# Patient Record
Sex: Male | Born: 1935 | Race: White | Hispanic: No | Marital: Married | Smoking: Former smoker
Health system: Southern US, Community
[De-identification: ages and names within clinical notes are randomized; demographics above are authoritative.]

## PROBLEM LIST (undated history)

## (undated) DIAGNOSIS — F0390 Unspecified dementia without behavioral disturbance: Secondary | ICD-10-CM

## (undated) DIAGNOSIS — I10 Essential (primary) hypertension: Secondary | ICD-10-CM

## (undated) DIAGNOSIS — H9190 Unspecified hearing loss, unspecified ear: Secondary | ICD-10-CM

---

## 2008-12-01 ENCOUNTER — Encounter: Admission: RE | Admit: 2008-12-01 | Discharge: 2008-12-01 | Payer: Self-pay | Admitting: Internal Medicine

## 2008-12-17 ENCOUNTER — Ambulatory Visit (HOSPITAL_COMMUNITY): Admission: RE | Admit: 2008-12-17 | Discharge: 2008-12-17 | Payer: Self-pay | Admitting: Urology

## 2008-12-23 ENCOUNTER — Encounter (HOSPITAL_COMMUNITY): Admission: RE | Admit: 2008-12-23 | Discharge: 2009-03-18 | Payer: Self-pay | Admitting: Urology

## 2008-12-28 ENCOUNTER — Encounter (INDEPENDENT_AMBULATORY_CARE_PROVIDER_SITE_OTHER): Payer: Self-pay | Admitting: Urology

## 2008-12-28 ENCOUNTER — Ambulatory Visit (HOSPITAL_BASED_OUTPATIENT_CLINIC_OR_DEPARTMENT_OTHER): Admission: RE | Admit: 2008-12-28 | Discharge: 2008-12-28 | Payer: Self-pay | Admitting: Urology

## 2010-07-11 ENCOUNTER — Encounter: Payer: Self-pay | Admitting: Internal Medicine

## 2010-07-25 IMAGING — CR DG CHEST 2V
2 series · 2 of 2 positions shown · non-contrast
Comparison: None

CLINICAL DATA: Gross hematuria, preop

CHEST - 2 VIEW

[view not recorded (1 of 2)]
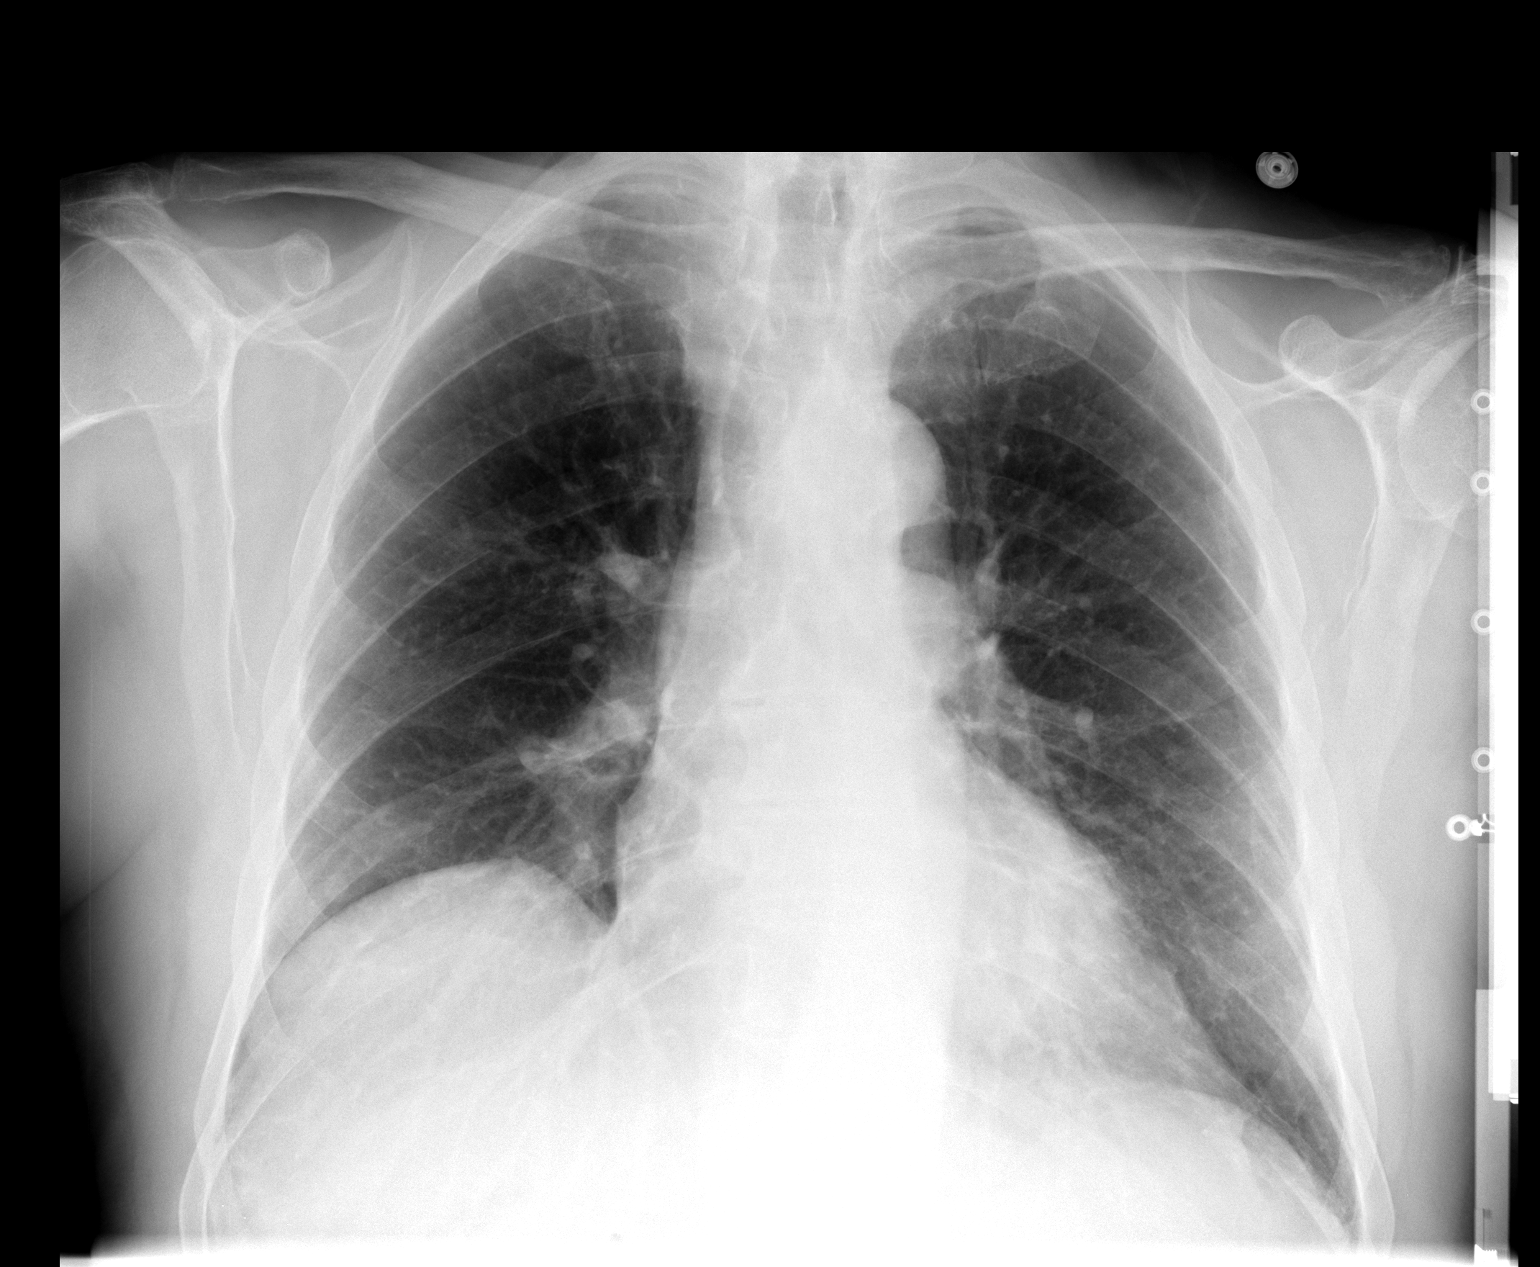

[view not recorded (2 of 2)]
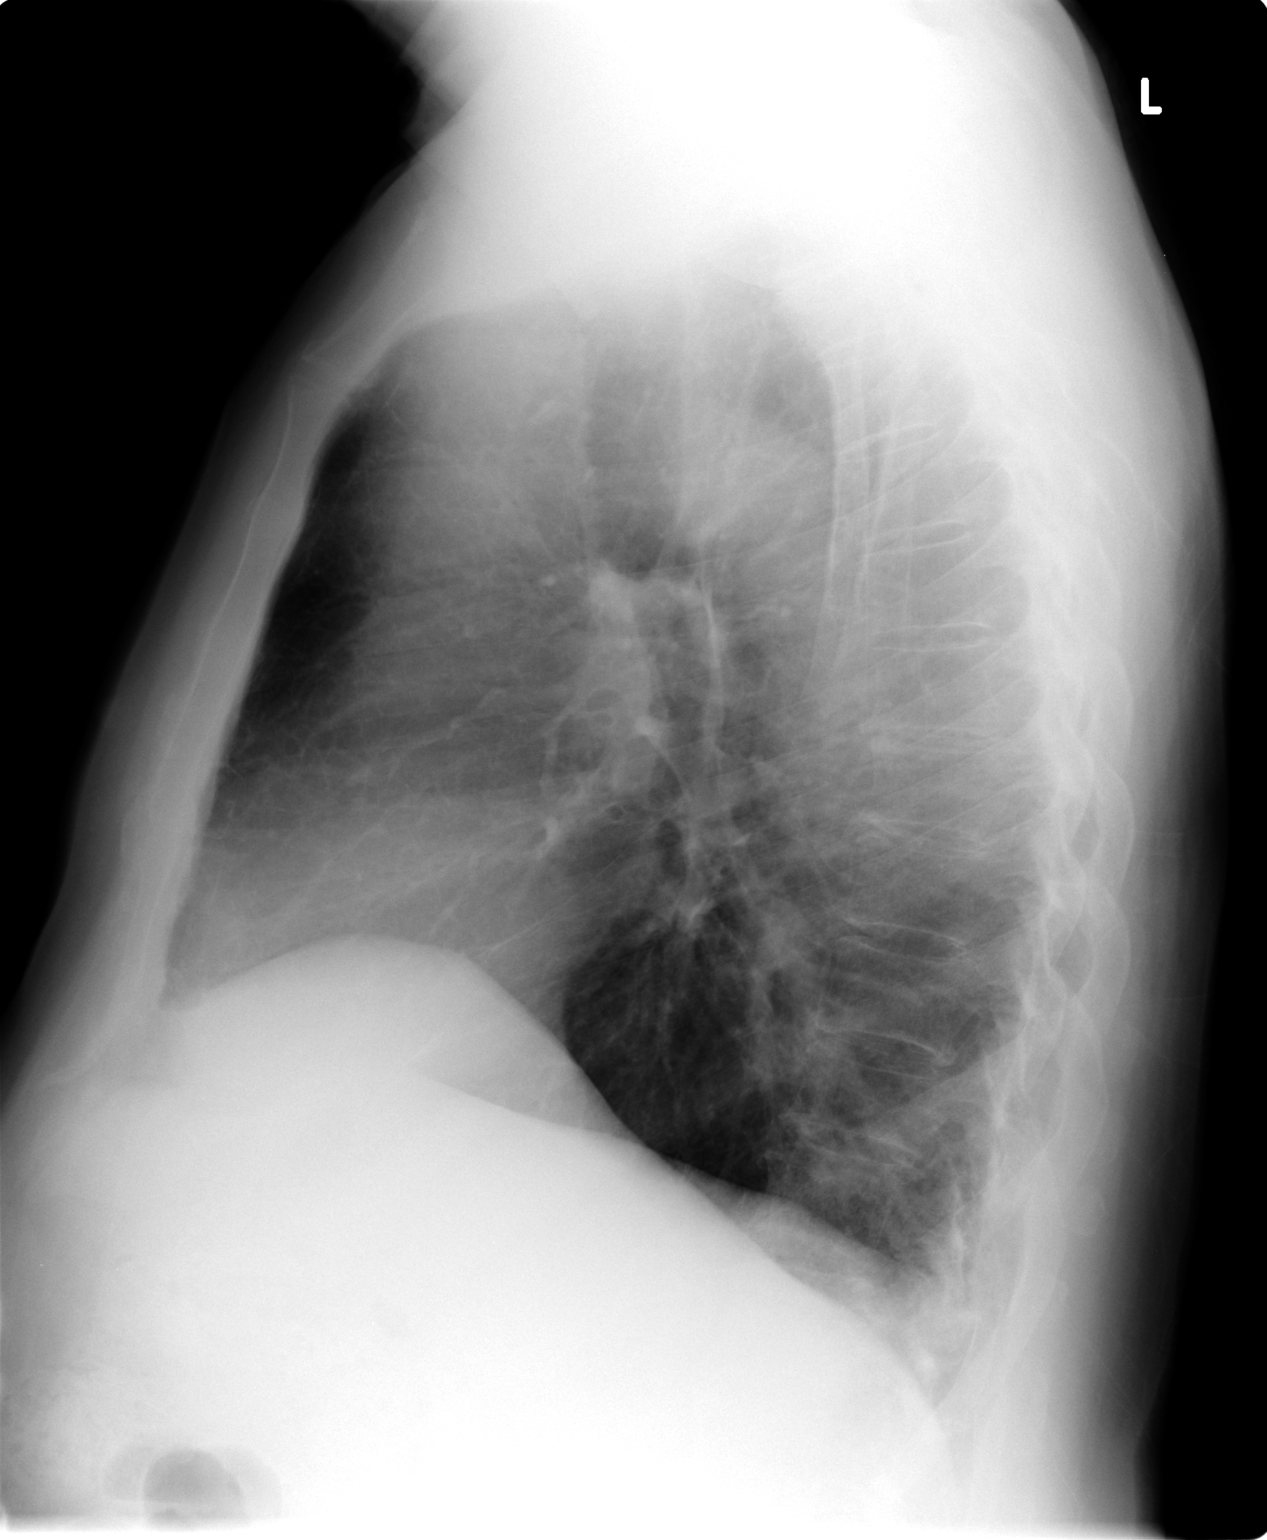

[2 of 2 positions shown; findings below may reference images not displayed]

FINDINGS: No active infiltrate or effusion is seen.  A small
nodular opacity at the right lung base may represent nipple shadow
but follow-up chest x-ray with nipple markers is recommended. The
heart is within normal limits in size. No bony abnormality is seen.
IMPRESSION: No active lung disease. Probable nipple shadow at the right lung
base.  Recommend follow-up chest x-ray with nipple markers.

## 2010-09-25 LAB — CROSSMATCH
ABO/RH(D): O POS
Antibody Screen: NEGATIVE

## 2010-09-25 LAB — COMPREHENSIVE METABOLIC PANEL
ALT: 16 U/L (ref 0–53)
ALT: 17 U/L (ref 0–53)
AST: 22 U/L (ref 0–37)
AST: 28 U/L (ref 0–37)
Albumin: 3.7 g/dL (ref 3.5–5.2)
Albumin: 3.8 g/dL (ref 3.5–5.2)
Alkaline Phosphatase: 39 U/L (ref 39–117)
Alkaline Phosphatase: 46 U/L (ref 39–117)
BUN: 19 mg/dL (ref 6–23)
BUN: 19 mg/dL (ref 6–23)
CO2: 23 mEq/L (ref 19–32)
CO2: 25 mEq/L (ref 19–32)
Calcium: 8.6 mg/dL (ref 8.4–10.5)
Calcium: 8.9 mg/dL (ref 8.4–10.5)
Chloride: 107 mEq/L (ref 96–112)
Chloride: 108 mEq/L (ref 96–112)
Creatinine, Ser: 1.13 mg/dL (ref 0.4–1.5)
Creatinine, Ser: 1.16 mg/dL (ref 0.4–1.5)
GFR calc Af Amer: >60 mL/min (ref >60)
GFR calc Af Amer: >60 mL/min (ref >60)
GFR calc non Af Amer: 60 mL/min — ABNORMAL LOW (ref >60)
GFR calc non Af Amer: >60 mL/min (ref >60)
Glucose, Bld: 114 mg/dL — ABNORMAL HIGH (ref 70–99)
Glucose, Bld: 114 mg/dL — ABNORMAL HIGH (ref 70–99)
Potassium: 3.8 mEq/L (ref 3.5–5.1)
Potassium: 4 mEq/L (ref 3.5–5.1)
Sodium: 138 mEq/L (ref 135–145)
Sodium: 140 mEq/L (ref 135–145)
Total Bilirubin: 0.4 mg/dL (ref 0.3–1.2)
Total Bilirubin: 0.5 mg/dL (ref 0.3–1.2)
Total Protein: 6.3 g/dL (ref 6.0–8.3)
Total Protein: 6.9 g/dL (ref 6.0–8.3)

## 2010-09-25 LAB — PROTIME-INR
INR: 1.1 (ref 0.00–1.49)
INR: 1.1 (ref 0.00–1.49)
Prothrombin Time: 14 seconds (ref 11.6–15.2)
Prothrombin Time: 14.7 seconds (ref 11.6–15.2)

## 2010-09-25 LAB — CBC
HCT: 23 % — ABNORMAL LOW (ref 39.0–52.0)
HCT: 32.3 % — ABNORMAL LOW (ref 39.0–52.0)
Hemoglobin: 10.6 g/dL — ABNORMAL LOW (ref 13.0–17.0)
Hemoglobin: 7.6 g/dL — CL (ref 13.0–17.0)
MCHC: 32.7 g/dL (ref 30.0–36.0)
MCHC: 33.2 g/dL (ref 30.0–36.0)
MCV: 83.9 fL (ref 78.0–100.0)
MCV: 85.6 fL (ref 78.0–100.0)
Platelets: 216 10*3/uL (ref 150–400)
Platelets: 222 10*3/uL (ref 150–400)
RBC: 2.69 MIL/uL — ABNORMAL LOW (ref 4.22–5.81)
RBC: 3.85 MIL/uL — ABNORMAL LOW (ref 4.22–5.81)
RDW: 13.6 % (ref 11.5–15.5)
RDW: 14.4 % (ref 11.5–15.5)
WBC: 6 10*3/uL (ref 4.0–10.5)
WBC: 6.6 10*3/uL (ref 4.0–10.5)

## 2010-09-25 LAB — ABO/RH: ABO/RH(D): O POS

## 2010-09-25 LAB — APTT
aPTT: 31 seconds (ref 24–37)
aPTT: 32 seconds (ref 24–37)

## 2010-11-01 NOTE — Op Note (Signed)
Greg Young, Greg Young               ACCOUNT NO.:  [id number]   MEDICAL RECORD NO.:  [id number]          PATIENT TYPE:  AMB   LOCATION:  [facility]                         FACILITY:  [facility]   PHYSICIAN:  Mark C. Vernie Ammons, M.D.  DATE OF BIRTH:  September 01, 1921   DATE OF PROCEDURE:  12/28/2008  DATE OF DISCHARGE:                               OPERATIVE REPORT   PREOPERATIVE DIAGNOSIS:  Gross hematuria.   POSTOPERATIVE DIAGNOSES:  1. Gross hematuria.  2. Bladder diverticular lesion.   PROCEDURES:  1. Cystoscopy.  2. Bladder biopsy.  3. Barbotage urine.  4. Fulguration of biopsy site.   SURGEON:  Mark C. Vernie Ammons, M.D.   ANESTHESIA:  General.   ESTIMATED BLOOD LOSS:  Minimal.   DRAINS:  None.   SPECIMENS:  1. Cold cup biopsy of the diverticular lesion.  2. Barbotage bladder washings for cytology.   COMPLICATIONS:  None.   INDICATIONS:  The patient is an 77 year old male who had gross  hematuria.  A CT scan revealed no abnormality of the upper tract,  although he did have a filling defect in the bladder that was felt to  possibly be a mass or clot.  Office cystoscopy revealed a large clot  adherent to posterior wall of the bladder where it was seen on CT scan  and the patient was also found to have anemia with a hemoglobin of 7.6  and a hematocrit of 23.0.  He was transfused 2 units of red blood cells,  underwent cardiac evaluation, and recheck of his hemoglobin and  hematocrit today reveals 10.6 and 32.3 respectively.  His PT and PTT are  normal and he has a normal platelet count.  We discussed the need to  evaluate his bladder further and to evacuate any clot to allow better  visualization of the bladder.  I have gone over the procedure in detail  with the patient, as well as its risks and complications.  He  understands and elected to proceed.   DESCRIPTION OF OPERATION:  After informed consent, the patient was  brought to the major OR and placed on the table, administered general  anesthesia, then moved to the dorsal lithotomy position.  His genitalia  was sterilely prepped and draped and an official time-out was then  performed.   Initially a 22-French cystoscope with 12-degree lens was passed under  direct vision down the urethra, which was noted to be entirely normal,  through the prostatic urethra, which revealed some trilobar hypertrophy,  but no intraprostatic lesions were identified.  Upon entering the  bladder, I note 4+ trabeculation with multiple cellules throughout the  bladder.  Some were wide mouthed and there was one on the posterior wall  that was narrow mouthed.  No tumor, stones, inflammatory lesions, or  clots were seen within the bladder on initial inspection.   A full inspection of the bladder was first undertaken with the rigid  cystoscope and no lesions were identified.  I then switched to the  flexible scope in order to visualize each of the diverticula.  A  systematic search of each of the pseudo-diverticula was  then undertaken.  All of them appeared to have normal-appearing mucosa except for a single  large-mouthed diverticulum located at the dome of the bladder.  Within  this diverticulum, there was an area of erythematous mucosa.  No  papillary lesions were seen though.   The cold cup forceps were used to obtain a biopsy from the erythematous  lesion and then this area was fulgurated with the Bugbee electrode.   I then, using sterile saline, performed barbotage of the bladder and  sent this for cytology.  Re-inspection of the bladder revealed no active  bleeding and therefore, the cystoscope was removed after the bladder was  drained and the patient was awakened and taken to the recovery room in  stable and satisfactory condition.  He tolerated the procedure well and  there were no intraoperative complications.   He will be discharged after he voids later today and received a B and O  suppository intraoperatively.  He will follow up  in my office on  01/11/2009 and contact me if he has any difficulty in the interim.      Mark C. Vernie Ammons, M.D.  Electronically Signed     MCO/MEDQ  D:  12/28/2008  T:  12/28/2008  Job:  [id number]

## 2013-01-29 ENCOUNTER — Ambulatory Visit (INDEPENDENT_AMBULATORY_CARE_PROVIDER_SITE_OTHER): Payer: Medicare Other | Admitting: General Surgery

## 2013-02-04 ENCOUNTER — Ambulatory Visit (INDEPENDENT_AMBULATORY_CARE_PROVIDER_SITE_OTHER): Payer: Medicare Other | Admitting: General Surgery

## 2013-02-28 ENCOUNTER — Other Ambulatory Visit: Payer: Self-pay | Admitting: Internal Medicine

## 2013-02-28 DIAGNOSIS — R410 Disorientation, unspecified: Secondary | ICD-10-CM

## 2013-02-28 DIAGNOSIS — R634 Abnormal weight loss: Secondary | ICD-10-CM

## 2013-03-04 ENCOUNTER — Ambulatory Visit
Admission: RE | Admit: 2013-03-04 | Discharge: 2013-03-04 | Disposition: A | Discharge status: Home / Self Care | Payer: Medicare Other | Source: Ambulatory Visit | Attending: Internal Medicine | Admitting: Internal Medicine

## 2013-03-04 DIAGNOSIS — R410 Disorientation, unspecified: Secondary | ICD-10-CM

## 2013-03-04 DIAGNOSIS — R634 Abnormal weight loss: Secondary | ICD-10-CM

## 2013-03-04 MED ORDER — IOHEXOL 300 MG/ML  SOLN
100.0000 mL | Freq: Once | INTRAMUSCULAR | Status: AC | PRN
  Administered 2013-03-04: 100 mL via INTRAVENOUS

## 2013-06-26 ENCOUNTER — Inpatient Hospital Stay (HOSPITAL_COMMUNITY)
Admission: EM | Admit: 2013-06-26 | Discharge: 2013-07-01 | DRG: 871 | Disposition: A | Discharge status: Home Health | Payer: Medicare Other | Attending: Internal Medicine | Admitting: Internal Medicine

## 2013-06-26 ENCOUNTER — Encounter (HOSPITAL_COMMUNITY): Payer: Self-pay | Admitting: Emergency Medicine

## 2013-06-26 ENCOUNTER — Emergency Department (HOSPITAL_COMMUNITY): Payer: Medicare Other

## 2013-06-26 DIAGNOSIS — I10 Essential (primary) hypertension: Secondary | ICD-10-CM | POA: Diagnosis present

## 2013-06-26 DIAGNOSIS — E860 Dehydration: Secondary | ICD-10-CM | POA: Diagnosis present

## 2013-06-26 DIAGNOSIS — J189 Pneumonia, unspecified organism: Secondary | ICD-10-CM | POA: Diagnosis present

## 2013-06-26 DIAGNOSIS — Z66 Do not resuscitate: Secondary | ICD-10-CM | POA: Diagnosis present

## 2013-06-26 DIAGNOSIS — I2489 Other forms of acute ischemic heart disease: Secondary | ICD-10-CM | POA: Diagnosis present

## 2013-06-26 DIAGNOSIS — H9190 Unspecified hearing loss, unspecified ear: Secondary | ICD-10-CM | POA: Diagnosis present

## 2013-06-26 DIAGNOSIS — A403 Sepsis due to Streptococcus pneumoniae: Principal | ICD-10-CM | POA: Diagnosis present

## 2013-06-26 DIAGNOSIS — Z87891 Personal history of nicotine dependence: Secondary | ICD-10-CM

## 2013-06-26 DIAGNOSIS — Z79899 Other long term (current) drug therapy: Secondary | ICD-10-CM

## 2013-06-26 DIAGNOSIS — N179 Acute kidney failure, unspecified: Secondary | ICD-10-CM | POA: Diagnosis present

## 2013-06-26 DIAGNOSIS — R6889 Other general symptoms and signs: Secondary | ICD-10-CM

## 2013-06-26 DIAGNOSIS — R7989 Other specified abnormal findings of blood chemistry: Secondary | ICD-10-CM | POA: Diagnosis present

## 2013-06-26 DIAGNOSIS — F0390 Unspecified dementia without behavioral disturbance: Secondary | ICD-10-CM | POA: Diagnosis present

## 2013-06-26 DIAGNOSIS — H9193 Unspecified hearing loss, bilateral: Secondary | ICD-10-CM

## 2013-06-26 DIAGNOSIS — G9340 Encephalopathy, unspecified: Secondary | ICD-10-CM | POA: Diagnosis present

## 2013-06-26 DIAGNOSIS — R6510 Systemic inflammatory response syndrome (SIRS) of non-infectious origin without acute organ dysfunction: Secondary | ICD-10-CM | POA: Diagnosis present

## 2013-06-26 DIAGNOSIS — R778 Other specified abnormalities of plasma proteins: Secondary | ICD-10-CM | POA: Diagnosis present

## 2013-06-26 DIAGNOSIS — I248 Other forms of acute ischemic heart disease: Secondary | ICD-10-CM | POA: Diagnosis present

## 2013-06-26 DIAGNOSIS — R748 Abnormal levels of other serum enzymes: Secondary | ICD-10-CM | POA: Diagnosis present

## 2013-06-26 DIAGNOSIS — R6520 Severe sepsis without septic shock: Secondary | ICD-10-CM | POA: Diagnosis present

## 2013-06-26 DIAGNOSIS — A419 Sepsis, unspecified organism: Secondary | ICD-10-CM | POA: Diagnosis present

## 2013-06-26 HISTORY — DX: Unspecified dementia, unspecified severity, without behavioral disturbance, psychotic disturbance, mood disturbance, and anxiety: F03.90

## 2013-06-26 HISTORY — DX: Unspecified hearing loss, unspecified ear: H91.90

## 2013-06-26 HISTORY — DX: Essential (primary) hypertension: I10

## 2013-06-26 LAB — COMPREHENSIVE METABOLIC PANEL
ALT: 9 U/L (ref 0–53)
AST: 20 U/L (ref 0–37)
Albumin: 2.8 g/dL — ABNORMAL LOW (ref 3.5–5.2)
Alkaline Phosphatase: 45 U/L (ref 39–117)
BUN: 24 mg/dL — ABNORMAL HIGH (ref 6–23)
CALCIUM: 8.4 mg/dL (ref 8.4–10.5)
CO2: 20 mEq/L (ref 19–32)
CREATININE: 1.47 mg/dL — AB (ref 0.50–1.35)
Chloride: 101 mEq/L (ref 96–112)
GFR calc Af Amer: 47 mL/min — ABNORMAL LOW (ref >90)
GFR calc non Af Amer: 40 mL/min — ABNORMAL LOW (ref >90)
Glucose, Bld: 120 mg/dL — ABNORMAL HIGH (ref 70–99)
Potassium: 4 mEq/L (ref 3.7–5.3)
Sodium: 137 mEq/L (ref 137–147)
Total Bilirubin: 0.7 mg/dL (ref 0.3–1.2)
Total Protein: 6.1 g/dL (ref 6.0–8.3)

## 2013-06-26 LAB — POCT I-STAT TROPONIN I: TROPONIN I, POC: 0.18 ng/mL — AB (ref 0.00–0.08)

## 2013-06-26 LAB — CG4 I-STAT (LACTIC ACID): LACTIC ACID, VENOUS: 2.07 mmol/L (ref 0.5–2.2)

## 2013-06-26 LAB — TROPONIN I: Troponin I: <0.3 ng/mL (ref <0.30)

## 2013-06-26 LAB — CBC WITH DIFFERENTIAL/PLATELET
BASOS ABS: 0 10*3/uL (ref 0.0–0.1)
Basophils Relative: 0 % (ref 0–1)
EOS ABS: 0 10*3/uL (ref 0.0–0.7)
EOS PCT: 0 % (ref 0–5)
HEMATOCRIT: 36.3 % — AB (ref 39.0–52.0)
Hemoglobin: 12.1 g/dL — ABNORMAL LOW (ref 13.0–17.0)
Lymphocytes Relative: 7 % — ABNORMAL LOW (ref 12–46)
Lymphs Abs: 0.7 10*3/uL (ref 0.7–4.0)
MCH: 29.6 pg (ref 26.0–34.0)
MCHC: 33.3 g/dL (ref 30.0–36.0)
MCV: 88.8 fL (ref 78.0–100.0)
Monocytes Absolute: 0.7 10*3/uL (ref 0.1–1.0)
Monocytes Relative: 7 % (ref 3–12)
Neutro Abs: 8.3 10*3/uL — ABNORMAL HIGH (ref 1.7–7.7)
Neutrophils Relative %: 86 % — ABNORMAL HIGH (ref 43–77)
Platelets: 169 10*3/uL (ref 150–400)
RBC: 4.09 MIL/uL — ABNORMAL LOW (ref 4.22–5.81)
RDW: 12.9 % (ref 11.5–15.5)
WBC: 9.7 10*3/uL (ref 4.0–10.5)

## 2013-06-26 LAB — URINALYSIS, ROUTINE W REFLEX MICROSCOPIC
BILIRUBIN URINE: NEGATIVE
Glucose, UA: NEGATIVE mg/dL
Ketones, ur: NEGATIVE mg/dL
NITRITE: NEGATIVE
PH: 6 (ref 5.0–8.0)
Protein, ur: 30 mg/dL — AB
SPECIFIC GRAVITY, URINE: 1.015 (ref 1.005–1.030)
Urobilinogen, UA: 0.2 mg/dL (ref 0.0–1.0)

## 2013-06-26 LAB — URINE MICROSCOPIC-ADD ON

## 2013-06-26 LAB — EXPECTORATED SPUTUM ASSESSMENT W REFEX TO RESP CULTURE

## 2013-06-26 LAB — INFLUENZA PANEL BY PCR (TYPE A & B)
H1N1FLUPCR: NOT DETECTED
INFLAPCR: NEGATIVE
INFLBPCR: NEGATIVE

## 2013-06-26 LAB — EXPECTORATED SPUTUM ASSESSMENT W GRAM STAIN, RFLX TO RESP C

## 2013-06-26 LAB — MRSA PCR SCREENING: MRSA by PCR: NEGATIVE

## 2013-06-26 LAB — STREP PNEUMONIAE URINARY ANTIGEN: Strep Pneumo Urinary Antigen: POSITIVE — AB

## 2013-06-26 MED ORDER — SODIUM CHLORIDE 0.9 % IJ SOLN
3.0000 mL | Freq: Two times a day (BID) | INTRAMUSCULAR | Status: DC
Start: 2013-06-26 — End: 2013-07-01
  Administered 2013-06-27 – 2013-06-30 (×6): 3 mL via INTRAVENOUS

## 2013-06-26 MED ORDER — ONDANSETRON HCL 4 MG PO TABS: 4.0000 mg | ORAL_TABLET | Freq: Four times a day (QID) | ORAL | Status: DC | PRN

## 2013-06-26 MED ORDER — MIRTAZAPINE 30 MG PO TABS
30.0000 mg | ORAL_TABLET | Freq: Every day | ORAL | Status: DC
  Administered 2013-06-26 – 2013-06-30 (×5): 30 mg via ORAL
  Filled 2013-06-26 (×6): qty 1

## 2013-06-26 MED ORDER — SODIUM CHLORIDE 0.9 % IV BOLUS (SEPSIS)
500.0000 mL | Freq: Once | INTRAVENOUS | Status: AC
  Administered 2013-06-26: 500 mL via INTRAVENOUS

## 2013-06-26 MED ORDER — GUAIFENESIN ER 600 MG PO TB12
600.0000 mg | ORAL_TABLET | Freq: Two times a day (BID) | ORAL | Status: DC
Start: 2013-06-26 — End: 2013-07-01
  Administered 2013-06-26 – 2013-07-01 (×10): 600 mg via ORAL
  Filled 2013-06-26 (×12): qty 1

## 2013-06-26 MED ORDER — OSELTAMIVIR PHOSPHATE 75 MG PO CAPS
75.0000 mg | ORAL_CAPSULE | Freq: Every day | ORAL | Status: DC
  Administered 2013-06-26: 75 mg via ORAL
  Filled 2013-06-26 (×3): qty 1

## 2013-06-26 MED ORDER — DONEPEZIL HCL 10 MG PO TABS
10.0000 mg | ORAL_TABLET | Freq: Every day | ORAL | Status: DC
  Administered 2013-06-26 – 2013-06-30 (×5): 10 mg via ORAL
  Filled 2013-06-26 (×6): qty 1

## 2013-06-26 MED ORDER — SODIUM CHLORIDE 0.9 % IV SOLN
INTRAVENOUS | Status: DC
  Administered 2013-06-26 – 2013-06-27 (×2): via INTRAVENOUS

## 2013-06-26 MED ORDER — ALBUTEROL SULFATE (2.5 MG/3ML) 0.083% IN NEBU
2.5000 mg | INHALATION_SOLUTION | Freq: Four times a day (QID) | RESPIRATORY_TRACT | Status: DC
  Administered 2013-06-26: 2.5 mg via RESPIRATORY_TRACT
  Filled 2013-06-26: qty 3

## 2013-06-26 MED ORDER — ALBUTEROL SULFATE (2.5 MG/3ML) 0.083% IN NEBU
5.0000 mg | INHALATION_SOLUTION | Freq: Once | RESPIRATORY_TRACT | Status: AC
Start: 2013-06-26 — End: 2013-06-26
  Administered 2013-06-26: 5 mg via RESPIRATORY_TRACT
  Filled 2013-06-26: qty 6

## 2013-06-26 MED ORDER — ONDANSETRON HCL 4 MG/2ML IJ SOLN: 4.0000 mg | Freq: Four times a day (QID) | INTRAMUSCULAR | Status: DC | PRN

## 2013-06-26 MED ORDER — SODIUM CHLORIDE 0.9 % IV BOLUS (SEPSIS)
1000.0000 mL | Freq: Once | INTRAVENOUS | Status: AC
  Administered 2013-06-26: 1000 mL via INTRAVENOUS

## 2013-06-26 MED ORDER — ACETAMINOPHEN 325 MG PO TABS
650.0000 mg | ORAL_TABLET | Freq: Four times a day (QID) | ORAL | Status: DC | PRN
  Administered 2013-06-27 – 2013-06-28 (×2): 650 mg via ORAL
  Filled 2013-06-26 (×2): qty 2

## 2013-06-26 MED ORDER — DEXTROSE 5 % IV SOLN
500.0000 mg | INTRAVENOUS | Status: DC
  Administered 2013-06-27: 500 mg via INTRAVENOUS
  Filled 2013-06-26 (×2): qty 500

## 2013-06-26 MED ORDER — ACETAMINOPHEN 650 MG RE SUPP: 650.0000 mg | Freq: Four times a day (QID) | RECTAL | Status: DC | PRN

## 2013-06-26 MED ORDER — AZITHROMYCIN 500 MG IV SOLR
500.0000 mg | Freq: Once | INTRAVENOUS | Status: AC
  Administered 2013-06-26: 500 mg via INTRAVENOUS

## 2013-06-26 MED ORDER — DEXTROSE 5 % IV SOLN
1.0000 g | Freq: Once | INTRAVENOUS | Status: AC
  Administered 2013-06-26: 1 g via INTRAVENOUS
  Filled 2013-06-26: qty 10

## 2013-06-26 MED ORDER — GUAIFENESIN-DM 100-10 MG/5ML PO SYRP: 5.0000 mL | ORAL_SOLUTION | ORAL | Status: DC | PRN

## 2013-06-26 MED ORDER — ALBUTEROL SULFATE (2.5 MG/3ML) 0.083% IN NEBU: 2.5000 mg | INHALATION_SOLUTION | RESPIRATORY_TRACT | Status: DC | PRN

## 2013-06-26 MED ORDER — DEXTROSE 5 % IV SOLN
1.0000 g | INTRAVENOUS | Status: DC
  Administered 2013-06-27: 1 g via INTRAVENOUS
  Filled 2013-06-26 (×2): qty 10

## 2013-06-26 MED ORDER — ENOXAPARIN SODIUM 30 MG/0.3ML ~~LOC~~ SOLN
30.0000 mg | SUBCUTANEOUS | Status: DC
  Administered 2013-06-26 – 2013-06-27 (×2): 30 mg via SUBCUTANEOUS
  Filled 2013-06-26 (×3): qty 0.3

## 2013-06-26 NOTE — ED Notes (Signed)
Pt reports fever and weakness and states taken 2 tylenol at 0930 this am.

## 2013-06-26 NOTE — H&P (Signed)
[facility]  History and Physical  Greg Young ZOX:[id number] DOB: 09/26/1921 DOA: 06/26/2013  Referring physician: EDP PCP: Georgianne FickAMACHANDRAN,Greg Young  Outpatient Specialists:  1. None  Chief Complaint: Cough, fever and disorientation  HPI: Greg Young is a 80 y.o. male with history of hypertension, moderate dementia, hard of hearing, resides with his spouse at home and has home care services taking care of him in the morning and by themselves at night. His wife was recently hospitalized and then transferred to rehabilitation and has been living alone since. History is obtained from patient's caregiver at bedside Ms. Greg Young. Patient is unable to provide history secondary to dementia and hard of hearing. He was apparently in usual state of health until 2 nights ago when he started having nonproductive cough and runny nose. Based on son's advice, caregiver gave patient 2 doses of Alka-Seltzer yesterday. This morning when she went to see him, although lites in the house were on, house was in a mass and patient was restless. His skin was "burning". No temperature was checked. Patient was brought to the ED with he was having cough with productive green sputum. Patient visited his wife at the rehabilitation facility over the weekend and received his flu shot on Monday. Patient denies dyspnea, chest pain, nausea, vomiting, diarrhea. His appetite apparently has been good. No urinary frequency or dysuria. No new skin rashes. In the ED, patient was hypotensive, creatinine 1.47, hemoglobin 12.1 and chest x-ray suggests acute bronchitis or early interstitial pneumonia. Patient continues to have soft blood pressures in the 90s despite 2.5 L IV fluid bolus. Hospitalist admission requested.  Review of Systems: All systems reviewed and apart from history of presenting illness, are negative.  Past Medical History  Diagnosis Date  . Hypertension   . Hard of hearing   . Dementia     "moderate dementia"    History reviewed. No pertinent past surgical history. Social History:  reports that he has quit smoking. He has quit using smokeless tobacco. His smokeless tobacco use included Chew. He reports that he does not drink alcohol or use illicit drugs. Married.  No Known Allergies  History reviewed. No pertinent family history. negative family history  Prior to Admission medications   Medication Sig Start Date End Date Taking? Authorizing Provider  acetaminophen (TYLENOL) 500 MG tablet Take 1,000 mg by mouth every 6 (six) hours as needed.   Yes Historical Provider, Young  donepezil (ARICEPT) 10 MG tablet Take 10 mg by mouth at bedtime.   Yes Historical Provider, Young  mirtazapine (REMERON) 30 MG tablet Take 30 mg by mouth at bedtime.   Yes Historical Provider, Young   Physical Exam: Filed Vitals:   06/26/13 1445 06/26/13 1500 06/26/13 1515 06/26/13 1540  BP: 113/47 103/46 92/43 90/50   Pulse: 66 65 63   Temp:      TempSrc:      Resp: 15 16 18    SpO2: 97% 97% 96%      General exam: Moderately built and nourished elderly male patient, lying comfortably supine on the gurney in no obvious distress.  Head, eyes and ENT: Nontraumatic and normocephalic. Pupils equally reacting to light and accommodation. Oral mucosa dry with mild thick green sputum at posterior pharyngeal wall.  Neck: Supple. No JVD, carotid bruit or thyromegaly.  Lymphatics: No lymphadenopathy.  Respiratory system: Harsh breath sounds in the bases with scattered occasional basal crackles but otherwise clear to auscultation. No increased work of breathing.  Cardiovascular system: S1 and S2 heard, RRR. No  JVD, murmurs, gallops, clicks or pedal edema.  Gastrointestinal system: Abdomen is nondistended, soft and nontender. Normal bowel sounds heard. No organomegaly or masses appreciated.  Central nervous system: Alert and oriented only to self. No focal neurological deficits.  Extremities: Symmetric 5 x 5 power. Peripheral pulses  symmetrically felt.   Skin: No rashes or acute findings.  Musculoskeletal system: Negative exam.  Psychiatry: Pleasant and cooperative.   Labs on Admission:  Basic Metabolic Panel:  Recent Labs Lab 06/26/13 1150  NA 137  K 4.0  CL 101  CO2 20  GLUCOSE 120*  BUN 24*  CREATININE 1.47*  CALCIUM 8.4   Liver Function Tests:  Recent Labs Lab 06/26/13 1150  AST 20  ALT 9  ALKPHOS 45  BILITOT 0.7  PROT 6.1  ALBUMIN 2.8*   No results found for this basename: LIPASE, AMYLASE,  in the last 168 hours No results found for this basename: AMMONIA,  in the last 168 hours CBC:  Recent Labs Lab 06/26/13 1150  WBC 9.7  NEUTROABS 8.3*  HGB 12.1*  HCT 36.3*  MCV 88.8  PLT 169   Cardiac Enzymes: No results found for this basename: CKTOTAL, CKMB, CKMBINDEX, TROPONINI,  in the last 168 hours  BNP (last 3 results) No results found for this basename: PROBNP,  in the last 8760 hours CBG: No results found for this basename: GLUCAP,  in the last 168 hours  Radiological Exams on Admission: Dg Chest Port 1 View  06/26/2013   CLINICAL DATA:  Cough and history of hypertension  EXAM: PORTABLE CHEST - 1 VIEW  COMPARISON:  PA and lateral chest x-ray dated December 28, 2008  FINDINGS: The right hemidiaphragm remains higher than the left. The lungs are slightly less well inflated today. The interstitial markings are more prominent bilaterally but especially on the right. The cardiac silhouette is top-normal in size but stable. The pulmonary vascularity is not engorged. No pleural effusion is evident. The mediastinum is normal in width. There is mild tortuosity of the descending thoracic aorta.  The nodule demonstrated just above the right hemidiaphragm on the previous study is not visible on the current exam.  IMPRESSION: Mildly increased interstitial markings bilaterally suggest peribronchial cuffing as might be seen with acute bronchitis or early interstitial pneumonia. There is no definite  evidence of CHF or of alveolar pneumonia.   Electronically Signed   By: David  [place]   On: 06/26/2013 12:02    EKG: Independently reviewed. Sinus rhythm at 66 beats per minute, normal axis and no acute changes.  Assessment/Plan Principal Problem:   Sepsis Active Problems:   CAP (community acquired pneumonia)   Flu-like symptoms   Hypertension   Hard of hearing   Dementia   Acute encephalopathy   Elevated troponin   Dehydration   Acute renal failure   Sepsis - Secondary to flulike illness and possible community-acquired pneumonia - Admit to step down unit secondary to soft blood pressures for close monitoring and management - Followup blood cultures, influenza panel PCR, urine Legionella & streptococcal antigen and sputum cultures - Aggressive IV fluid hydration , IV antibiotics and Tamiflu.  Flulike illness - Rule out influenza - Droplet isolation. Followup influenza panel PCR. - Treat empirically with Tamiflu until ruled out.  Possible community-acquired pneumonia - Workup as above. Continue IV Rocephin and azithromycin. Will need followup chest x-rays to ensure resolution of findings.  Acute encephalopathy - Secondary to acute illness complicating underlying dementia. Clinically improved. Monitor closely.  Acute renal failure -  Likely secondary to intravascular volume depletion. IV fluids and follow BMP.  Dehydration - Secondary to Sepsis. IV fluids  Elevated troponin - Secondary to demand ischemia. No chest pain and EKG without acute findings. Trend troponins and follow 2-D echo.  Dementia/hard of hearing     Code Status: DO NOT RESUSCITATE   Family Communication: Discussed with caregiver at bedside   Disposition Plan: To be determined   Time spent: 65 minutes   Annamary Buschman, Young, FACP, FHM. [facility] Pager [phone]  If 7PM-7AM, please contact night-coverage [email or web address] Password [facility] 06/26/2013, 4:22 PM

## 2013-06-26 NOTE — ED Notes (Signed)
Hongalgi states to hold additional fluid until up on floor. Hongalgi aware of BP 89/44.

## 2013-06-26 NOTE — Progress Notes (Signed)
UR completed 

## 2013-06-26 NOTE — ED Notes (Signed)
2nd set of cultures and istats drawn by R. Covil in left hand.

## 2013-06-26 NOTE — ED Notes (Signed)
Bed: WA19 Expected date:  Expected time:  Means of arrival:  Comments: EMS 

## 2013-06-26 NOTE — Progress Notes (Signed)
Pt confirmed with ED CM pcp was RAMACHANDRAN. EPIC updated

## 2013-06-26 NOTE — ED Notes (Signed)
Per EMS pt coming from home with c/o generalized weakness and fever since this morning.

## 2013-06-26 NOTE — ED Provider Notes (Signed)
CSN: [id number]     Arrival date & time 06/26/13  1047 History   First MD Initiated Contact with Patient 06/26/13 1058     Chief Complaint  Patient presents with  . Fever  . Weakness   (Consider location/radiation/quality/duration/timing/severity/associated sxs/prior Treatment) Patient is a 80 y.o. male presenting with fever and weakness. The history is provided by the patient.  Fever Associated symptoms: cough   Associated symptoms: no chest pain and no sore throat   Weakness Associated symptoms include shortness of breath. Pertinent negatives include no chest pain and no abdominal pain.   patient is a poor historian. He states that at 10 AM this morning she began to weakness and fevers. He has been coughing. He states he feels somewhat fatigued. He may have some urinary frequency. He states his been eating less. He cannot tell me how high his fever went. He states he feels as if there is something in his chest to cough up. No diarrhea, but states his his felt that he had to go to the bathroom all morning.  Past Medical History  Diagnosis Date  . Hypertension   . Hard of hearing   . Dementia     "moderate dementia"   History reviewed. No pertinent past surgical history. History reviewed. No pertinent family history. History  Substance Use Topics  . Smoking status: Former Games developermoker  . Smokeless tobacco: Former NeurosurgeonUser    Types: Chew  . Alcohol Use: No    Review of Systems  Constitutional: Positive for fever and appetite change.  HENT: Negative for sore throat.   Respiratory: Positive for cough and shortness of breath.   Cardiovascular: Negative for chest pain.  Gastrointestinal: Negative for abdominal pain and constipation.  Genitourinary: Positive for frequency.  Musculoskeletal: Negative for back pain and neck pain.  Skin: Negative for pallor and wound.  Neurological: Positive for weakness and light-headedness.    Allergies  Review of patient's allergies indicates no known  allergies.  Home Medications   Current Outpatient Rx  Name  Route  Sig  Dispense  Refill  . acetaminophen (TYLENOL) 500 MG tablet   Oral   Take 1,000 mg by mouth every 6 (six) hours as needed.         . donepezil (ARICEPT) 10 MG tablet   Oral   Take 10 mg by mouth at bedtime.         . mirtazapine (REMERON) 30 MG tablet   Oral   Take 30 mg by mouth at bedtime.          BP 89/44  Pulse 57  Temp(Src) 98.1 F (36.7 C) (Rectal)  Resp 16  SpO2 97% Physical Exam  Nursing note and vitals reviewed. Constitutional: He appears well-developed and well-nourished.  HENT:  Head: Normocephalic and atraumatic.  Eyes: EOM are normal. Pupils are equal, round, and reactive to light.  Neck: Normal range of motion. Neck supple.  Cardiovascular: Normal rate, regular rhythm and normal heart sounds.   No murmur heard. Pulmonary/Chest:  Diffuse harsh breath sounds with prolonged expirations and wheezing/rhonchi.  Abdominal: Soft. Bowel sounds are normal. He exhibits no distension and no mass. There is no tenderness. There is no rebound and no guarding.  Musculoskeletal: Normal range of motion. He exhibits no edema.  Neurological: He is alert. No cranial nerve deficit.  Mild confusion, or difficulty understanding.  Skin: Skin is warm and dry.  Psychiatric: He has a normal mood and affect.    ED Course  Procedures (including  critical care time) Labs Review Labs Reviewed  CBC WITH DIFFERENTIAL - Abnormal; Notable for the following:    RBC 4.09 (*)    Hemoglobin 12.1 (*)    HCT 36.3 (*)    Neutrophils Relative % 86 (*)    Neutro Abs 8.3 (*)    Lymphocytes Relative 7 (*)    All other components within normal limits  COMPREHENSIVE METABOLIC PANEL - Abnormal; Notable for the following:    Glucose, Bld 120 (*)    BUN 24 (*)    Creatinine, Ser 1.47 (*)    Albumin 2.8 (*)    GFR calc non Af Amer 40 (*)    GFR calc Af Amer 47 (*)    All other components within normal limits   URINALYSIS, ROUTINE W REFLEX MICROSCOPIC - Abnormal; Notable for the following:    APPearance CLOUDY (*)    Hgb urine dipstick LARGE (*)    Protein, ur 30 (*)    Leukocytes, UA TRACE (*)    All other components within normal limits  POCT I-STAT TROPONIN I - Abnormal; Notable for the following:    Troponin i, poc 0.18 (*)    All other components within normal limits  CULTURE, BLOOD (ROUTINE X 2)  CULTURE, BLOOD (ROUTINE X 2)  URINE MICROSCOPIC-ADD ON  INFLUENZA PANEL BY PCR (TYPE A & B, H1N1)  LEGIONELLA ANTIGEN, URINE  STREP PNEUMONIAE URINARY ANTIGEN  CG4 I-STAT (LACTIC ACID)   Imaging Review Dg Chest Port 1 View  06/26/2013   CLINICAL DATA:  Cough and history of hypertension  EXAM: PORTABLE CHEST - 1 VIEW  COMPARISON:  PA and lateral chest x-ray dated December 28, 2008  FINDINGS: The right hemidiaphragm remains higher than the left. The lungs are slightly less well inflated today. The interstitial markings are more prominent bilaterally but especially on the right. The cardiac silhouette is top-normal in size but stable. The pulmonary vascularity is not engorged. No pleural effusion is evident. The mediastinum is normal in width. There is mild tortuosity of the descending thoracic aorta.  The nodule demonstrated just above the right hemidiaphragm on the previous study is not visible on the current exam.  IMPRESSION: Mildly increased interstitial markings bilaterally suggest peribronchial cuffing as might be seen with acute bronchitis or early interstitial pneumonia. There is no definite evidence of CHF or of alveolar pneumonia.   Electronically Signed   By: David  [place]   On: 06/26/2013 12:02    EKG Interpretation    Date/Time:  Thursday June 26 2013 10:59:13 EST Ventricular Rate:  66 PR Interval:  115 QRS Duration: 91 QT Interval:  441 QTC Calculation: 462 R Axis:   15 Text Interpretation:  Sinus rhythm Non-specific ST-t changes Borderline short PR interval since last tracing no  significant change Confirmed by KOHUT  MD, STEPHEN ([id number]) on 06/26/2013 11:07:44 AM            MDM   1. CAP (community acquired pneumonia)   2. Hypertension   3. Hard of hearing, bilateral   4. Dementia   5. Sepsis   6. Flu-like symptoms   7. Acute encephalopathy    CRITICAL CARE Performed by: Billee Cashing Total critical care time: 30 Critical care time was exclusive of separately billable procedures and treating other patients. Critical care was necessary to treat or prevent imminent or life-threatening deterioration. Critical care was time spent personally by me on the following activities: development of treatment plan with patient and/or surrogate as well as nursing,  discussions with consultants, evaluation of patient's response to treatment, examination of patient, obtaining history from patient or surrogate, ordering and performing treatments and interventions, ordering and review of laboratory studies, ordering and review of radiographic studies, pulse oximetry and re-evaluation of patient's condition.  Patient with hypotension and cough. We'll treat with antibiotics. Coming from home. Patient has had blood pressure that comes up with IV fluids but decrease again after. Initially sent to telemetry bed, updated to step down after hypotension occurred.  Juliet Rude. Rubin Payor, MD 06/26/13 [phone]

## 2013-06-26 NOTE — ED Notes (Signed)
Critical I stat - Dr Pickering aware 

## 2013-06-27 ENCOUNTER — Inpatient Hospital Stay (HOSPITAL_COMMUNITY): Payer: Medicare Other

## 2013-06-27 DIAGNOSIS — I517 Cardiomegaly: Secondary | ICD-10-CM

## 2013-06-27 DIAGNOSIS — N179 Acute kidney failure, unspecified: Secondary | ICD-10-CM

## 2013-06-27 LAB — CBC
HCT: 35.1 % — ABNORMAL LOW (ref 39.0–52.0)
Hemoglobin: 11.8 g/dL — ABNORMAL LOW (ref 13.0–17.0)
MCH: 29.7 pg (ref 26.0–34.0)
MCHC: 33.6 g/dL (ref 30.0–36.0)
MCV: 88.4 fL (ref 78.0–100.0)
PLATELETS: 152 10*3/uL (ref 150–400)
RBC: 3.97 MIL/uL — ABNORMAL LOW (ref 4.22–5.81)
RDW: 13.1 % (ref 11.5–15.5)
WBC: 8.5 10*3/uL (ref 4.0–10.5)

## 2013-06-27 LAB — BASIC METABOLIC PANEL
BUN: 17 mg/dL (ref 6–23)
CO2: 22 mEq/L (ref 19–32)
CREATININE: 1.07 mg/dL (ref 0.50–1.35)
Calcium: 8.3 mg/dL — ABNORMAL LOW (ref 8.4–10.5)
Chloride: 104 mEq/L (ref 96–112)
GFR calc non Af Amer: 59 mL/min — ABNORMAL LOW (ref >90)
GFR, EST AFRICAN AMERICAN: 69 mL/min — AB (ref >90)
Glucose, Bld: 110 mg/dL — ABNORMAL HIGH (ref 70–99)
POTASSIUM: 3.7 meq/L (ref 3.7–5.3)
Sodium: 140 mEq/L (ref 137–147)

## 2013-06-27 LAB — LEGIONELLA ANTIGEN, URINE: Legionella Antigen, Urine: NEGATIVE

## 2013-06-27 LAB — TROPONIN I
Troponin I: <0.3 ng/mL (ref <0.30)
Troponin I: <0.3 ng/mL (ref <0.30)

## 2013-06-27 NOTE — Progress Notes (Signed)
  Echocardiogram 2D Echocardiogram has been performed.  Greg Young 06/27/2013, 2:04 PM

## 2013-06-27 NOTE — Evaluation (Signed)
Physical Therapy Evaluation Patient Details Name: Greg Young MRN: [id number] DOB: 08/07/1921 Today's Date: 06/27/2013 Time: 1157-1209 PT Time Calculation (min): 12 min  PT Assessment / Plan / Recommendation History of Present Illness  80 y.o. male with history of hypertension, moderate dementia, hard of hearing, resides with his spouse at home and has home care services taking care of him in the morning and by themselves at night. His wife was recently hospitalized and then transferred to rehabilitation and has been living alone since. He presented with flulike symptoms and was admitted for sepsis possibly secondary to community-acquired pneumonia.  Clinical Impression  Pt admitted with sepsis. Pt currently with functional limitations due to the deficits listed below (see PT Problem List).  Pt will benefit from skilled PT to increase their independence and safety with mobility to allow discharge to the venue listed below.  Pt ambulated in hallway with RW however very unsteady without assistive device.  Recommend supervision for mobility/OOB upon d/c for safety and HHPT if d/c home.  Pt reports caregivers 5x/week days however he has been living alone since spouse has been in SNF for rehab.      PT Assessment  Patient needs continued PT services    Follow Up Recommendations  Home health PT;Supervision for mobility/OOB    Does the patient have the potential to tolerate intense rehabilitation      Barriers to Discharge        Equipment Recommendations  None recommended by PT    Recommendations for Other Services     Frequency Min 3X/week    Precautions / Restrictions Precautions Precautions: Fall   Pertinent Vitals/Pain VSS       Mobility  Bed Mobility Overal bed mobility: Needs Assistance Bed Mobility: Supine to Sit;Sit to Supine Supine to sit: Supervision Sit to supine: Supervision General bed mobility comments: supervision for lines Transfers Overall transfer level:  Needs assistance Transfers: Sit to/from Stand Sit to Stand: Min assist General transfer comment: assist to rise Ambulation/Gait Ambulation/Gait assistance: Min guard;Min assist Ambulation Distance (Feet): 200 Feet Assistive device: Rolling walker (2 wheeled) Gait Pattern/deviations: Step-through pattern;Narrow base of support;Trunk flexed Gait velocity: decr General Gait Details: pt required assist to steady with room ambulation so given RW for hallway with improvement in steadiness.    Exercises     PT Diagnosis: Difficulty walking  PT Problem List: Decreased strength;Decreased activity tolerance;Decreased mobility;Decreased balance;Decreased knowledge of use of DME PT Treatment Interventions: Gait training;DME instruction;Functional mobility training;Therapeutic activities;Therapeutic exercise;Patient/family education;Neuromuscular re-education;Balance training     PT Goals(Current goals can be found in the care plan section) Acute Rehab PT Goals PT Goal Formulation: With patient Time For Goal Achievement: 07/11/13 Potential to Achieve Goals: Good  Visit Information  Last PT Received On: 06/27/13 Assistance Needed: +1 History of Present Illness: 80 y.o. male with history of hypertension, moderate dementia, hard of hearing, resides with his spouse at home and has home care services taking care of him in the morning and by themselves at night. His wife was recently hospitalized and then transferred to rehabilitation and has been living alone since. He presented with flulike symptoms and was admitted for sepsis possibly secondary to community-acquired pneumonia.       Prior Functioning  Home Living Family/patient expects to be discharged to:: Private residence Living Arrangements: Spouse/significant other Available Help at Discharge: Personal care attendant Type of Home: House Home Layout: One level Home Equipment: Dan HumphreysWalker - 2 wheels Additional Comments: pt reports spouse  currently in SNF for rehab  however to be released soon, they have caregivers 5x/week Prior Function Level of Independence: Independent Communication Communication: HOH    Cognition  Cognition Arousal/Alertness: Awake/alert Behavior During Therapy: WFL for tasks assessed/performed Overall Cognitive Status: History of cognitive impairments - at baseline    Extremity/Trunk Assessment Lower Extremity Assessment Lower Extremity Assessment: Generalized weakness   Balance    End of Session PT - End of Session Activity Tolerance: Patient tolerated treatment well Patient left: in bed;with call bell/phone within reach  GP     Mohid Furuya,KATHrine E 06/27/2013, 1:07 PM Zenovia Jarred, PT, DPT 06/27/2013 Pager: [phone]

## 2013-06-27 NOTE — Progress Notes (Addendum)
[facility] PROGRESS NOTE    Jedi Catalfamo WJX:[id number] DOB: 1922/04/11 DOA: 06/26/2013 PCP: Georgianne Fick, MD  HPI/Brief narrative 80 y.o. male with history of hypertension, moderate dementia, hard of hearing, resides with his spouse at home and has home care services taking care of him in the morning and by themselves at night. His wife was recently hospitalized and then transferred to rehabilitation and has been living alone since. He presented with flulike symptoms and was admitted for sepsis possibly secondary to community-acquired pneumonia.  Assessment/Plan:  Sepsis  - Secondary to flulike illness and possible community-acquired pneumonia  - He was admitted to step down unit secondary to soft blood pressures for close monitoring and management  - Blood cultures x2: Negative to date. Sputum culture pending. Influenza panel PCR negative. Urine streptococcal antigen positive. - Clinically improved-hypotension resolved and blood pressures actually rising.  Flulike illness  - Influenza panel PCR negative. DC droplet isolation and Tamiflu.  Possible pneumococcal community-acquired pneumonia  - Workup as above. Continue IV Rocephin and azithromycin. Will need followup chest x-rays to ensure resolution of findings.   Acute encephalopathy  - Secondary to acute illness complicating underlying dementia. Clinically improved in mental status may be at baseline.  Acute renal failure  - Likely secondary to intravascular volume depletion. Resolved after IV fluids.  Dehydration  - Secondary to Sepsis. Resolved. DC IV fluids.  Elevated troponin  - POC troponin x1 was elevated but subsequent troponins x3 negative. EKG without acute changes. Follow 2-D echo. Possibly from sepsis.  Dementia/hard of hearing  Hypertension  - was not on maintenance medications at home. Blood pressures rising. Monitor closely and if remain consistently elevated, consider antihypertensives i.e.  amlodipine 2.5 mg daily.      Code Status: DO NOT RESUSCITATE Family Communication: None at bedside Disposition Plan: Transfer to medical floor. Home when medically stable   Consultants:  None  Procedures:  None  Antibiotics:  IV Rocephin and azithromycin 1/8 >   Subjective: States that he feels fine. Denies complaints. Denies dyspnea or cough. Asking for food.  Objective: Filed Vitals:   06/26/13 2105 06/27/13 0000 06/27/13 0400 06/27/13 0800  BP: 142/50 154/58 152/59 182/70  Pulse: 72 67 63 77  Temp:  98.3 F (36.8 C) 97.8 F (36.6 C) 97.3 F (36.3 C)  TempSrc:  Oral Oral Oral  Resp: 14 16 13 12   Height:      Weight:      SpO2: 98% 97% 96% 99%    Intake/Output Summary (Last 24 hours) at 06/27/13 1111 Last data filed at 06/27/13 0800  Gross per 24 hour  Intake 2047.5 ml  Output    100 ml  Net 1947.5 ml   Filed Weights   06/26/13 1700  Weight: 64.9 kg (143 lb 1.3 oz)     Exam:  General exam: Pleasant elderly male lying comfortably in bed Respiratory system: Posterior lung fields crackles right >left. Clear anteriorly. No increased work of breathing. Cardiovascular system: S1 & S2 heard, RRR. No JVD, murmurs, gallops, clicks or pedal edema. Telemetry: Sinus rhythm with occasional PVCs. Gastrointestinal system: Abdomen is nondistended, soft and nontender. Normal bowel sounds heard. Central nervous system: Alert and oriented x2. No focal neurological deficits. Extremities: Symmetric 5 x 5 power.   Data Reviewed: Basic Metabolic Panel:  Recent Labs Lab 06/26/13 1150 06/27/13 0449  NA 137 140  K 4.0 3.7  CL 101 104  CO2 20 22  GLUCOSE 120* 110*  BUN 24* 17  CREATININE 1.47* 1.07  CALCIUM 8.4 8.3*   Liver Function Tests:  Recent Labs Lab 06/26/13 1150  AST 20  ALT 9  ALKPHOS 45  BILITOT 0.7  PROT 6.1  ALBUMIN 2.8*   No results found for this basename: LIPASE, AMYLASE,  in the last 168 hours No results found for this basename:  AMMONIA,  in the last 168 hours CBC:  Recent Labs Lab 06/26/13 1150 06/27/13 0449  WBC 9.7 8.5  NEUTROABS 8.3*  --   HGB 12.1* 11.8*  HCT 36.3* 35.1*  MCV 88.8 88.4  PLT 169 152   Cardiac Enzymes:  Recent Labs Lab 06/26/13 1658 06/27/13 0015 06/27/13 0449  TROPONINI <0.30 <0.30 <0.30   BNP (last 3 results) No results found for this basename: PROBNP,  in the last 8760 hours CBG: No results found for this basename: GLUCAP,  in the last 168 hours  Recent Results (from the past 240 hour(s))  CULTURE, BLOOD (ROUTINE X 2)     Status: None   Collection Time    06/26/13 11:50 AM      Result Value Range Status   Specimen Description BLOOD RIGHT ANTECUBITAL   Final   Special Requests BOTTLES DRAWN AEROBIC AND ANAEROBIC 2ML   Final   Culture  Setup Time     Final   Value: 06/26/2013 14:04     Performed at [facility]   Culture     Final   Value:        BLOOD CULTURE RECEIVED NO GROWTH TO DATE CULTURE WILL BE HELD FOR 5 DAYS BEFORE ISSUING A FINAL NEGATIVE REPORT     Performed at [facility]   Report Status PENDING   Incomplete  CULTURE, BLOOD (ROUTINE X 2)     Status: None   Collection Time    06/26/13 11:53 AM      Result Value Range Status   Specimen Description BLOOD LEFT HAND    Final   Special Requests BOTTLES DRAWN AEROBIC AND ANAEROBIC 2ML   Final   Culture  Setup Time     Final   Value: 06/26/2013 14:04     Performed at [facility]   Culture     Final   Value:        BLOOD CULTURE RECEIVED NO GROWTH TO DATE CULTURE WILL BE HELD FOR 5 DAYS BEFORE ISSUING A FINAL NEGATIVE REPORT     Performed at [facility]   Report Status PENDING   Incomplete  MRSA PCR SCREENING     Status: None   Collection Time    06/26/13  5:07 PM      Result Value Range Status   MRSA by PCR NEGATIVE  NEGATIVE Final   Comment:            The GeneXpert MRSA Assay (FDA     approved for NASAL specimens     only), is one component of a     comprehensive  MRSA colonization     surveillance program. It is not     intended to diagnose MRSA     infection nor to guide or     monitor treatment for     MRSA infections.  CULTURE, EXPECTORATED SPUTUM-ASSESSMENT     Status: None   Collection Time    06/26/13 11:04 PM      Result Value Range Status   Specimen Description SPUTUM   Final   Special Requests NONE   Final   Sputum evaluation  Final   Value: THIS SPECIMEN IS ACCEPTABLE. RESPIRATORY CULTURE REPORT TO FOLLOW.   Report Status 06/26/2013 FINAL   Final  CULTURE, RESPIRATORY (NON-EXPECTORATED)     Status: None   Collection Time    06/26/13 11:04 PM      Result Value Range Status   Specimen Description SPUTUM   Final   Special Requests NONE   Final   Gram Stain     Final   Value: ABUNDANT WBC PRESENT, PREDOMINANTLY PMN     RARE SQUAMOUS EPITHELIAL CELLS PRESENT     RARE GRAM POSITIVE COCCI     IN PAIRS     Performed at [facility]   Culture PENDING   Incomplete   Report Status PENDING   Incomplete      Studies: Dg Chest Port 1 View  06/26/2013   CLINICAL DATA:  Cough and history of hypertension  EXAM: PORTABLE CHEST - 1 VIEW  COMPARISON:  PA and lateral chest x-ray dated December 28, 2008  FINDINGS: The right hemidiaphragm remains higher than the left. The lungs are slightly less well inflated today. The interstitial markings are more prominent bilaterally but especially on the right. The cardiac silhouette is top-normal in size but stable. The pulmonary vascularity is not engorged. No pleural effusion is evident. The mediastinum is normal in width. There is mild tortuosity of the descending thoracic aorta.  The nodule demonstrated just above the right hemidiaphragm on the previous study is not visible on the current exam.  IMPRESSION: Mildly increased interstitial markings bilaterally suggest peribronchial cuffing as might be seen with acute bronchitis or early interstitial pneumonia. There is no definite evidence of CHF or of  alveolar pneumonia.   Electronically Signed   By: David  [place]   On: 06/26/2013 12:02        Scheduled Meds: . azithromycin  500 mg Intravenous Q24H  . cefTRIAXone (ROCEPHIN)  IV  1 g Intravenous Q24H  . donepezil  10 mg Oral QHS  . enoxaparin (LOVENOX) injection  30 mg Subcutaneous Q24H  . guaiFENesin  600 mg Oral BID  . mirtazapine  30 mg Oral QHS  . sodium chloride  3 mL Intravenous Q12H   Continuous Infusions:    Principal Problem:   Sepsis Active Problems:   CAP (community acquired pneumonia)   Flu-like symptoms   Hypertension   Hard of hearing   Dementia   Acute encephalopathy   Elevated troponin   Dehydration   Acute renal failure    Time spent: 45 minutes    Homero Hyson, MD, FACP, FHM. [facility] Pager [phone]  If 7PM-7AM, please contact night-coverage [email or web address] Password [email or web address] 06/27/2013, 11:11 AM    LOS: 1 day

## 2013-06-28 DIAGNOSIS — F0390 Unspecified dementia without behavioral disturbance: Secondary | ICD-10-CM

## 2013-06-28 MED ORDER — LEVOFLOXACIN 750 MG PO TABS
750.0000 mg | ORAL_TABLET | ORAL | Status: DC
  Administered 2013-06-28: 750 mg via ORAL
  Filled 2013-06-28: qty 1

## 2013-06-28 MED ORDER — ENOXAPARIN SODIUM 40 MG/0.4ML ~~LOC~~ SOLN
40.0000 mg | SUBCUTANEOUS | Status: DC
  Administered 2013-06-28 – 2013-06-30 (×3): 40 mg via SUBCUTANEOUS
  Filled 2013-06-28 (×5): qty 0.4

## 2013-06-28 NOTE — Progress Notes (Signed)
[facility] PROGRESS NOTE    Greg Young ZOX:[id number] DOB: Jan 07, 1922 DOA: 06/26/2013 PCP: Georgianne Fick, MD  HPI/Brief narrative 80 y.o. male with history of hypertension, moderate dementia, hard of hearing, resides with his spouse at home and has home care services taking care of him in the morning and by themselves at night. His wife was recently hospitalized and then transferred to rehabilitation and has been living alone since. He presented with flulike symptoms and was admitted for sepsis possibly secondary to community-acquired pneumonia.  Assessment/Plan:  Sepsis  - Secondary to flulike illness and possible community-acquired pneumonia  - He was admitted to step down unit secondary to soft blood pressures for close monitoring and management  - Blood cultures x2: Negative to date. Sputum culture pending. Influenza panel PCR negative. Urine streptococcal antigen positive. - Clinically improved-hypotension resolved and blood pressures actually rising.  Flulike illness  - Influenza panel PCR negative. DC droplet isolation and Tamiflu.  Possible pneumococcal community-acquired pneumonia  - Workup as above. Continue IV Rocephin and azithromycin. Will need followup chest x-rays to ensure resolution of findings. Transition to oral Levaquin 1/10.  Acute encephalopathy  - Secondary to acute illness complicating underlying dementia. Clinically improved in mental status may be at baseline.  Acute renal failure  - Likely secondary to intravascular volume depletion. Resolved after IV fluids.  Dehydration  - Secondary to Sepsis. Resolved. DC IV fluids.  Elevated troponin  - POC troponin x1 was elevated but subsequent troponins x3 negative. EKG without acute changes. 2-D echo: Normal EF. Possibly from sepsis.  Dementia/hard of hearing  Hypertension  - was not on maintenance medications at home. Blood pressures rising. Monitor closely and if remain consistently elevated,  consider antihypertensives i.e. amlodipine 2.5 mg daily.      Code Status: DO NOT RESUSCITATE Family Communication: unable to reach family. Discussed with care giver, [name] Disposition Plan: Home possibly next 24-48 hours   Consultants:  None  Procedures:  None  Antibiotics:  IV Rocephin and azithromycin 1/8 > 1/9  Oral levofloxacin 1/10 >  Subjective: Pleasantly confused. Denies complaints  Objective: Filed Vitals:   06/27/13 1500 06/27/13 1810 06/27/13 2200 06/28/13 0601  BP: 127/56 149/72 160/70 160/83  Pulse:  78 79 77  Temp: 96.8 F (36 C) 97.8 F (36.6 C) 97.8 F (36.6 C) 98.2 F (36.8 C)  TempSrc: Axillary Oral Oral Oral  Resp:  16 16 16   Height:      Weight:      SpO2:  99% 93% 98%    Intake/Output Summary (Last 24 hours) at 06/28/13 1342 Last data filed at 06/28/13 0700  Gross per 24 hour  Intake    540 ml  Output      0 ml  Net    540 ml   Filed Weights   06/26/13 1700  Weight: 64.9 kg (143 lb 1.3 oz)     Exam:  General exam: Pleasant elderly male sitting on chair Respiratory system: Few basal crackles but otherwise clear to auscultation. No increased work of breathing. Cardiovascular system: S1 & S2 heard, RRR. No JVD, murmurs, gallops, clicks or pedal edema.  Gastrointestinal system: Abdomen is nondistended, soft and nontender. Normal bowel sounds heard. Central nervous system: Alert and oriented x self . No focal neurological deficits. Extremities: Symmetric 5 x 5 power.   Data Reviewed: Basic Metabolic Panel:  Recent Labs Lab 06/26/13 1150 06/27/13 0449  NA 137 140  K 4.0 3.7  CL 101 104  CO2 20 22  GLUCOSE 120* 110*  BUN 24* 17  CREATININE 1.47* 1.07  CALCIUM 8.4 8.3*   Liver Function Tests:  Recent Labs Lab 06/26/13 1150  AST 20  ALT 9  ALKPHOS 45  BILITOT 0.7  PROT 6.1  ALBUMIN 2.8*   No results found for this basename: LIPASE, AMYLASE,  in the last 168 hours No results found for this basename:  AMMONIA,  in the last 168 hours CBC:  Recent Labs Lab 06/26/13 1150 06/27/13 0449  WBC 9.7 8.5  NEUTROABS 8.3*  --   HGB 12.1* 11.8*  HCT 36.3* 35.1*  MCV 88.8 88.4  PLT 169 152   Cardiac Enzymes:  Recent Labs Lab 06/26/13 1658 06/27/13 0015 06/27/13 0449  TROPONINI <0.30 <0.30 <0.30   BNP (last 3 results) No results found for this basename: PROBNP,  in the last 8760 hours CBG: No results found for this basename: GLUCAP,  in the last 168 hours  Recent Results (from the past 240 hour(s))  CULTURE, BLOOD (ROUTINE X 2)     Status: None   Collection Time    06/26/13 11:50 AM      Result Value Range Status   Specimen Description BLOOD RIGHT ANTECUBITAL   Final   Special Requests BOTTLES DRAWN AEROBIC AND ANAEROBIC 2ML   Final   Culture  Setup Time     Final   Value: 06/26/2013 14:04     Performed at [facility]   Culture     Final   Value:        BLOOD CULTURE RECEIVED NO GROWTH TO DATE CULTURE WILL BE HELD FOR 5 DAYS BEFORE ISSUING A FINAL NEGATIVE REPORT     Performed at [facility]   Report Status PENDING   Incomplete  CULTURE, BLOOD (ROUTINE X 2)     Status: None   Collection Time    06/26/13 11:53 AM      Result Value Range Status   Specimen Description BLOOD LEFT HAND    Final   Special Requests BOTTLES DRAWN AEROBIC AND ANAEROBIC 2ML   Final   Culture  Setup Time     Final   Value: 06/26/2013 14:04     Performed at [facility]   Culture     Final   Value:        BLOOD CULTURE RECEIVED NO GROWTH TO DATE CULTURE WILL BE HELD FOR 5 DAYS BEFORE ISSUING A FINAL NEGATIVE REPORT     Performed at [facility]   Report Status PENDING   Incomplete  MRSA PCR SCREENING     Status: None   Collection Time    06/26/13  5:07 PM      Result Value Range Status   MRSA by PCR NEGATIVE  NEGATIVE Final   Comment:            The GeneXpert MRSA Assay (FDA     approved for NASAL specimens     only), is one component of a     comprehensive  MRSA colonization     surveillance program. It is not     intended to diagnose MRSA     infection nor to guide or     monitor treatment for     MRSA infections.  CULTURE, EXPECTORATED SPUTUM-ASSESSMENT     Status: None   Collection Time    06/26/13 11:04 PM      Result Value Range Status   Specimen Description SPUTUM   Final  Special Requests NONE   Final   Sputum evaluation     Final   Value: THIS SPECIMEN IS ACCEPTABLE. RESPIRATORY CULTURE REPORT TO FOLLOW.   Report Status 06/26/2013 FINAL   Final  CULTURE, RESPIRATORY (NON-EXPECTORATED)     Status: None   Collection Time    06/26/13 11:04 PM      Result Value Range Status   Specimen Description SPUTUM   Final   Special Requests NONE   Final   Gram Stain     Final   Value: ABUNDANT WBC PRESENT, PREDOMINANTLY PMN     RARE SQUAMOUS EPITHELIAL CELLS PRESENT     RARE GRAM POSITIVE COCCI     IN PAIRS     Performed at [facility]   Culture     Final   Value: Culture reincubated for better growth     Performed at [facility]   Report Status PENDING   Incomplete      Studies: Dg Chest 2 View  06/27/2013   CLINICAL DATA:  Chest pain with shortness of breath and cough.  EXAM: CHEST  2 VIEW  COMPARISON:  DG CHEST 1V PORT dated 06/26/2013; DG CHEST 2 VIEW dated 12/28/2008  FINDINGS: The heart size and mediastinal contours are stable with aortic atherosclerosis. There is chronic elevation of the right hemidiaphragm. There are progressively lower lung volumes with increased patchy bibasilar pulmonary opacities. Small pleural effusions are present on the lateral view. No pneumothorax or significant osseous abnormality is seen.  IMPRESSION: Increased bibasilar airspace opacities suspicious for developing pneumonia. There are small bilateral pleural effusions. No definite edema.   Electronically Signed   By: Roxy Horseman M.D.   On: 06/27/2013 11:50        Scheduled Meds: . azithromycin  500 mg Intravenous Q24H  .  cefTRIAXone (ROCEPHIN)  IV  1 g Intravenous Q24H  . donepezil  10 mg Oral QHS  . enoxaparin (LOVENOX) injection  40 mg Subcutaneous Q24H  . guaiFENesin  600 mg Oral BID  . mirtazapine  30 mg Oral QHS  . sodium chloride  3 mL Intravenous Q12H   Continuous Infusions:    Principal Problem:   Sepsis Active Problems:   CAP (community acquired pneumonia)   Flu-like symptoms   Hypertension   Hard of hearing   Dementia   Acute encephalopathy   Elevated troponin   Dehydration   Acute renal failure    Time spent: 25 minutes    Indra Wolters, MD, FACP, FHM. [facility] Pager [phone]  If 7PM-7AM, please contact night-coverage [email or web address] Password [email or web address] 06/28/2013, 1:42 PM    LOS: 2 days

## 2013-06-28 NOTE — Progress Notes (Signed)
\  ANTIBIOTIC CONSULT NOTE - INITIAL  Pharmacy Consult for Levaquin PO Indication: CAP  No Known Allergies  Patient Measurements: Height: 5\' 9"  (175.3 cm) Weight: 143 lb 1.3 oz (64.9 kg) IBW/kg (Calculated) : 70.7   Vital Signs: Temp: 98.2 F (36.8 C) (01/10 0601) Temp src: Oral (01/10 0601) BP: 160/83 mmHg (01/10 0601) Pulse Rate: 77 (01/10 0601) Intake/Output from previous day: 01/09 0701 - 01/10 0700 In: 550 [P.O.:240; I.V.:10; IV Piggyback:300] Out: -   Labs:  Recent Labs  06/26/13 1150 06/27/13 0449  WBC 9.7 8.5  HGB 12.1* 11.8*  PLT 169 152  CREATININE 1.47* 1.07   Estimated Creatinine Clearance: 43 ml/min (by C-G formula based on Cr of 1.07).  Anti-infectives:  1/8 >> Azith >> 1/10 1/8 >> Ceftriax >> 1/10 1/8 >> Oseltamivir >> 1/9 1/10 >> Levaquin >>   Assessment: [age] yoM admitted 06/26/13 with cough, fever and disorientation, likely d/t CAP.  Initially he was treated with ceftriaxone and azithromycin, but now pharmacy is consulted to dose Levaquin PO.  Tmax: 98.2  WBCs: 8.5  Renal: SCr 1.07, CrCl ~ 43  Influenza negative, Strep pneumo urinary antigen positive.   Goal of Therapy:  Appropriate abx dosing, eradication of infection.   Plan:   Levaquin 750mg  PO q48h.  Follow up renal fxn and culture results.  Lynann Beaverhristine Bintou Lafata PharmD, BCPS Pager [phone] 06/28/2013 2:05 PM

## 2013-06-29 LAB — CULTURE, RESPIRATORY

## 2013-06-29 LAB — CULTURE, RESPIRATORY W GRAM STAIN

## 2013-06-29 MED ORDER — CEFPODOXIME PROXETIL 200 MG PO TABS
200.0000 mg | ORAL_TABLET | Freq: Two times a day (BID) | ORAL | Status: DC
  Administered 2013-06-29 – 2013-07-01 (×5): 200 mg via ORAL
  Filled 2013-06-29 (×7): qty 1

## 2013-06-29 MED ORDER — AZITHROMYCIN 500 MG PO TABS
500.0000 mg | ORAL_TABLET | Freq: Every day | ORAL | Status: DC
  Administered 2013-06-29 – 2013-07-01 (×3): 500 mg via ORAL
  Filled 2013-06-29 (×4): qty 1

## 2013-06-29 NOTE — Progress Notes (Signed)
[facility] PROGRESS NOTE    Greg Young WGN:[id number] DOB: 02/08/1922 DOA: 06/26/2013 PCP: Georgianne FickAMACHANDRAN,AJITH, MD  HPI/Brief narrative 80 y.o. male with history of hypertension, moderate dementia, hard of hearing, resides with his spouse at home and has home care services taking care of him in the morning and by themselves at night. His wife was recently hospitalized and then transferred to rehabilitation and has been living alone since. He presented with flulike symptoms and was admitted for sepsis possibly secondary to community-acquired pneumonia.  Assessment/Plan:  Sepsis  - Secondary to flulike illness and possible community-acquired pneumonia  - He was admitted to step down unit secondary to soft blood pressures for close monitoring and management  - Blood cultures x2: Negative to date. Sputum culture pending. Influenza panel PCR negative. Urine streptococcal antigen positive. - Clinically improved-hypotension resolved and blood pressures actually rising.  Flulike illness  - Influenza panel PCR negative. DC droplet isolation and Tamiflu.  Streptococcal pneumoniae community-acquired pneumonia  - Workup as above. Continue IV Rocephin and azithromycin. Will need followup chest x-rays to ensure resolution of findings. Transitioned to PO Levaquin on 1/10 but in light of confusion, switch to oral Vantin and azithromycin. Sputum culture shows Streptococcus pneumoniae.  Acute encephalopathy  - Secondary to acute illness complicating underlying dementia. Continues to be pleasantly confused-contributed to by hospital delirium. DC quinolones.  Acute renal failure  - Likely secondary to intravascular volume depletion. Resolved after IV fluids. Encourage by mouth intake.  Dehydration  - Secondary to Sepsis. Resolved. DC IV fluids.  Elevated troponin  - POC troponin x1 was elevated but subsequent troponins x3 negative. EKG without acute changes. 2-D echo: Normal EF. Possibly from  sepsis.  Dementia/hard of hearing  Hypertension  - was not on maintenance medications at home. Blood pressures rising. Monitor closely and if remain consistently elevated, consider antihypertensives i.e. amlodipine 2.5 mg daily.      Code Status: DO NOT RESUSCITATE Family Communication: unable to reach family. Discussed with care giver, Ms. Drue FlirtConnie Cobb 1/11 Disposition Plan: Home possibly 1/12. Ms. Allison QuarryCobb indicates that they will be able to provide 24/7 supervision for the first few days after discharge and then will reevaluate. Advised her that if he continues to be confused after that period, he may need SNF.   Consultants:  None  Procedures:  None  Antibiotics:  IV Rocephin and azithromycin 1/8 > 1/9  Oral levofloxacin 1/10 > 1/11  Oral Vantin and azithromycin 1/11 >  Subjective: Pleasantly confused. Denies complaints. Per nursing, no acute events.  Objective: Filed Vitals:   06/28/13 0601 06/28/13 1402 06/28/13 2141 06/29/13 0550  BP: 160/83 157/69 148/77 189/77  Pulse: 77 69 73 78  Temp: 98.2 F (36.8 C) 97.6 F (36.4 C) 98 F (36.7 C) 97.4 F (36.3 C)  TempSrc: Oral Oral Oral Oral  Resp: 16 20 18 18   Height:      Weight:      SpO2: 98% 97% 96% 95%    Intake/Output Summary (Last 24 hours) at 06/29/13 1414 Last data filed at 06/29/13 1300  Gross per 24 hour  Intake    683 ml  Output    550 ml  Net    133 ml   Filed Weights   06/26/13 1700  Weight: 64.9 kg (143 lb 1.3 oz)     Exam:  General exam: Pleasant elderly lying in bed Respiratory system: Few basal crackles but otherwise clear to auscultation. No increased work of breathing. Cardiovascular system: S1 & S2 heard, RRR.  No JVD, murmurs, gallops, clicks or pedal edema.  Gastrointestinal system: Abdomen is nondistended, soft and nontender. Normal bowel sounds heard. Central nervous system: Alert and oriented x self . No focal neurological deficits. Extremities: Symmetric 5 x 5 power.   Data  Reviewed: Basic Metabolic Panel:  Recent Labs Lab 06/26/13 1150 06/27/13 0449  NA 137 140  K 4.0 3.7  CL 101 104  CO2 20 22  GLUCOSE 120* 110*  BUN 24* 17  CREATININE 1.47* 1.07  CALCIUM 8.4 8.3*   Liver Function Tests:  Recent Labs Lab 06/26/13 1150  AST 20  ALT 9  ALKPHOS 45  BILITOT 0.7  PROT 6.1  ALBUMIN 2.8*   No results found for this basename: LIPASE, AMYLASE,  in the last 168 hours No results found for this basename: AMMONIA,  in the last 168 hours CBC:  Recent Labs Lab 06/26/13 1150 06/27/13 0449  WBC 9.7 8.5  NEUTROABS 8.3*  --   HGB 12.1* 11.8*  HCT 36.3* 35.1*  MCV 88.8 88.4  PLT 169 152   Cardiac Enzymes:  Recent Labs Lab 06/26/13 1658 06/27/13 0015 06/27/13 0449  TROPONINI <0.30 <0.30 <0.30   BNP (last 3 results) No results found for this basename: PROBNP,  in the last 8760 hours CBG: No results found for this basename: GLUCAP,  in the last 168 hours  Recent Results (from the past 240 hour(s))  CULTURE, BLOOD (ROUTINE X 2)     Status: None   Collection Time    06/26/13 11:50 AM      Result Value Range Status   Specimen Description BLOOD RIGHT ANTECUBITAL   Final   Special Requests BOTTLES DRAWN AEROBIC AND ANAEROBIC   Final   Culture  Setup Time     Final   Value: 06/26/2013 14:04     Performed at [facility]   Culture     Final   Value:        BLOOD CULTURE RECEIVED NO GROWTH TO DATE CULTURE WILL BE HELD FOR 5 DAYS BEFORE ISSUING A FINAL NEGATIVE REPORT     Performed at [facility]   Report Status PENDING   Incomplete  CULTURE, BLOOD (ROUTINE X 2)     Status: None   Collection Time    06/26/13 11:53 AM      Result Value Range Status   Specimen Description BLOOD LEFT HAND    Final   Special Requests BOTTLES DRAWN AEROBIC AND ANAEROBIC   Final   Culture  Setup Time     Final   Value: 06/26/2013 14:04     Performed at [facility]   Culture     Final   Value:        BLOOD CULTURE RECEIVED NO  GROWTH TO DATE CULTURE WILL BE HELD FOR 5 DAYS BEFORE ISSUING A FINAL NEGATIVE REPORT     Performed at [facility]   Report Status PENDING   Incomplete  MRSA PCR SCREENING     Status: None   Collection Time    06/26/13  5:07 PM      Result Value Range Status   MRSA by PCR NEGATIVE  NEGATIVE Final   Comment:            The GeneXpert MRSA Assay (FDA     approved for NASAL specimens     only), is one component of a     comprehensive MRSA colonization     surveillance program.  It is not     intended to diagnose MRSA     infection nor to guide or     monitor treatment for     MRSA infections.  CULTURE, EXPECTORATED SPUTUM-ASSESSMENT     Status: None   Collection Time    06/26/13 11:04 PM      Result Value Range Status   Specimen Description SPUTUM   Final   Special Requests NONE   Final   Sputum evaluation     Final   Value: THIS SPECIMEN IS ACCEPTABLE. RESPIRATORY CULTURE REPORT TO FOLLOW.   Report Status 06/26/2013 FINAL   Final  CULTURE, RESPIRATORY (NON-EXPECTORATED)     Status: None   Collection Time    06/26/13 11:04 PM      Result Value Range Status   Specimen Description SPUTUM   Final   Special Requests NONE   Final   Gram Stain     Final   Value: ABUNDANT WBC PRESENT, PREDOMINANTLY PMN     RARE SQUAMOUS EPITHELIAL CELLS PRESENT     RARE GRAM POSITIVE COCCI     IN PAIRS     Performed at [facility]   Culture     Final   Value: MODERATE STREPTOCOCCUS PNEUMONIAE     Performed at [facility]   Report Status 06/29/2013 FINAL   Final   Organism ID, Bacteria STREPTOCOCCUS PNEUMONIAE   Final      Studies: No results found.      Scheduled Meds: . donepezil  10 mg Oral QHS  . enoxaparin (LOVENOX) injection  40 mg Subcutaneous Q24H  . guaiFENesin  600 mg Oral BID  . levofloxacin  750 mg Oral Q48H  . mirtazapine  30 mg Oral QHS  . sodium chloride  3 mL Intravenous Q12H   Continuous Infusions:    Principal Problem:   Sepsis Active  Problems:   CAP (community acquired pneumonia)   Flu-like symptoms   Hypertension   Hard of hearing   Dementia   Acute encephalopathy   Elevated troponin   Dehydration   Acute renal failure    Time spent: 25 minutes    Chaslyn Eisen, MD, FACP, FHM. [facility] Pager [phone]  If 7PM-7AM, please contact night-coverage [email or web address] Password [email or web address] 06/29/2013, 2:14 PM    LOS: 3 days

## 2013-06-29 NOTE — Progress Notes (Signed)
Patient's caregiver, Drue FlirtConnie Cobb, called to say that she would like to be notified when patient is to be discharged.  If DC is planned for tomorrow, she will not be able to get patient before 5p-6p.   Drue FlirtConnie Cobb can be reached at [phone].

## 2013-06-29 NOTE — Progress Notes (Signed)
Per Junious Dresseronnie, Patient's caregiver, the [place] [facility] [place] [facility] [address] [place]

## 2013-06-29 NOTE — Progress Notes (Signed)
Patient has been very confused today.  He was unable to tell me his name, he did not know the day of the week or the month of the year.  When told to get a sip of water, he proceeded to blow bubbles into the straw rather than to suck in through the straw.  He has made inappropriate comments throughout the day, such as "Come here [place]" and "I was just in that other room."  RN has made attempts to reorient the patient to person, place, and time.

## 2013-06-30 DIAGNOSIS — E860 Dehydration: Secondary | ICD-10-CM

## 2013-06-30 LAB — BASIC METABOLIC PANEL
BUN: 24 mg/dL — ABNORMAL HIGH (ref 6–23)
CALCIUM: 8.8 mg/dL (ref 8.4–10.5)
CO2: 25 mEq/L (ref 19–32)
Chloride: 108 mEq/L (ref 96–112)
Creatinine, Ser: 1.38 mg/dL — ABNORMAL HIGH (ref 0.50–1.35)
GFR, EST AFRICAN AMERICAN: 51 mL/min — AB (ref >90)
GFR, EST NON AFRICAN AMERICAN: 44 mL/min — AB (ref >90)
Glucose, Bld: 116 mg/dL — ABNORMAL HIGH (ref 70–99)
Potassium: 3.9 mEq/L (ref 3.7–5.3)
SODIUM: 144 meq/L (ref 137–147)

## 2013-06-30 LAB — CBC
HCT: 37.8 % — ABNORMAL LOW (ref 39.0–52.0)
Hemoglobin: 12.7 g/dL — ABNORMAL LOW (ref 13.0–17.0)
MCH: 29.8 pg (ref 26.0–34.0)
MCHC: 33.6 g/dL (ref 30.0–36.0)
MCV: 88.7 fL (ref 78.0–100.0)
PLATELETS: 265 10*3/uL (ref 150–400)
RBC: 4.26 MIL/uL (ref 4.22–5.81)
RDW: 13.3 % (ref 11.5–15.5)
WBC: 6.8 10*3/uL (ref 4.0–10.5)

## 2013-06-30 MED ORDER — SODIUM CHLORIDE 0.45 % IV SOLN
INTRAVENOUS | Status: AC
  Administered 2013-06-30 – 2013-07-01 (×2): via INTRAVENOUS

## 2013-06-30 NOTE — Progress Notes (Signed)
Physical Therapy Treatment Patient Details Name: Essa Malachi MRN: [id number] DOB: 12-Feb-1922 Today's Date: 06/30/2013 Time: 4098-1191 PT Time Calculation (min): 47 min  PT Assessment / Plan / Recommendation  History of Present Illness 80 y.o. male with history of hypertension, moderate dementia, hard of hearing, resides with his spouse at home and has home care services taking care of him in the morning and by themselves at night. His wife was recently hospitalized and then transferred to rehabilitation and has been living alone since. He presented with flulike symptoms and was admitted for sepsis possibly secondary to community-acquired pneumonia.   PT Comments   Pt in bed AxOx1 but became more alert to present situation into the session.  Assisted OOB to amb to BR to trial void/BM due to inabilty to know if he needed to.  Assisted with hygiene as pt was unsteady with standing.  When staqnding at sink to wash hands, pt c/o max dizziness.  Chair brought to him BP was 51/49.  Reported to RN and entered in [place]. Pt positioned in recliner with pillows and alarm. Pt demon decreased mobility, increased unsteadiness and required increased assist.  Will consult LPT as pt may need ST Rehab at SNF vs HH as indicated in eval.   Follow Up Recommendations  SNF     Does the patient have the potential to tolerate intense rehabilitation     Barriers to Discharge        Equipment Recommendations  None recommended by PT    Recommendations for Other Services    Frequency Min 3X/week   Progress towards PT Goals Progress towards PT goals: Progressing toward goals  Plan      Precautions / Restrictions Precautions Precautions: Fall Precaution Comments: HOH, decreased vision(unable to read menu) Restrictions Weight Bearing Restrictions: No    Pertinent Vitals/Pain No c/o pain    Mobility  Bed Mobility Overal bed mobility: Needs Assistance Bed Mobility: Supine to Sit;Sit to Supine Supine to sit:  Min guard Sit to supine: Min guard General bed mobility comments: increased, increased time.  Struggled with scooting to EOB and the c/o MAX fatigue. Transfers Overall transfer level: Needs assistance Equipment used: Rolling walker (2 wheeled) Sit to Stand: Min assist;Mod assist General transfer comment: 50% VC's on hand placement to push up vs pull up from walker.  Also VC's for turn completion. Ambulation/Gait Ambulation/Gait assistance: Min assist Ambulation Distance (Feet): 20 Feet (10 feet x 2 to and from BR only) Assistive device: Rolling walker (2 wheeled) Gait Pattern/deviations: Step-to pattern;Trunk flexed Gait velocity: decreased General Gait Details: Very unsteady gait with MAX c/o fatigue and dizzyness.  BP after amb sitting in chair 51/49.  Reported to RN and entered in EPIC vital signs. Pt also demonstrates decreased awareness.  HIGH FALL RISK.     PT Goals (current goals can now be found in the care plan section)    Visit Information  Last PT Received On: 06/30/13 Assistance Needed: +1 History of Present Illness: 80 y.o. male with history of hypertension, moderate dementia, hard of hearing, resides with his spouse at home and has home care services taking care of him in the morning and by themselves at night. His wife was recently hospitalized and then transferred to rehabilitation and has been living alone since. He presented with flulike symptoms and was admitted for sepsis possibly secondary to community-acquired pneumonia.    Subjective Data      Cognition       Balance     End  of Session PT - End of Session Equipment Utilized During Treatment: Gait belt Activity Tolerance: Treatment limited secondary to medical complications (Comment) (Hypotension) Patient left: in chair;with call bell/phone within reach;with chair alarm set   Felecia ShellingLori Nashid Pellum  PTA [facility]  Acute  Rehab Pager      [phone]  Due to change in D/C recommendation.  LPT co sign is needed.

## 2013-06-30 NOTE — Progress Notes (Signed)
Clinical Social Work Department BRIEF PSYCHOSOCIAL ASSESSMENT 06/30/2013  Patient:  Greg Young,Greg Young     Account Number:  [id number]     Admit date:  06/26/2013  Clinical Social Worker:  Dennison BullaGERBER,Ivonna Kinnick, LCSW  Date/Time:  06/30/2013 02:00 PM  Referred by:  Physician  Date Referred:  06/30/2013 Referred for  SNF Placement   Other Referral:   Interview type:  Other - See comment Other interview type:   Junious Dresseronnie Cobb-caregiver    PSYCHOSOCIAL DATA Living Status:  ALONE Admitted from facility:   Level of care:   Primary support name:  Junious DresserConnie Primary support relationship to patient:  HOME HEALTH NURSE Degree of support available:   Strong    CURRENT CONCERNS Current Concerns  Post-Acute Placement   Other Concerns:    SOCIAL WORK ASSESSMENT / PLAN CSW received referral to complete psychosocial assessment. CSW reviewed chart and attempted to meet with patient but patient disoriented. CSW attempted to call son but no answer. CSW spoke with caregiver, Junious DresserConnie, who reports she is making decisions for patient because son is currently in [place].    CSW introduced myself and explained role. Caregiver reports that this is patient's first hospital visit and he is disoriented because of medications. Patient has always voiced his opinion that he wants to stay home and caregiver reports that family is making sure he can stay as long as needed. Patient's wife plans to return home soon from rehab and patient wants to be home to welcome her. Caregiver requests HH via [facility] and reports she will pick up patient at DC.    CSW made CM aware of HH needs. CSW is signing off but available if further needs arise.   Assessment/plan status:  No Further Intervention Required Other assessment/ plan:   Information/referral to community resources:   [facility]    PATIENT'S/FAMILY'S RESPONSE TO PLAN OF CARE: Patient unable to fully participate in assessment. Patient's caregiver very involved in care and  reports she wants the best for patient. Caregiver feels that patient will be re-oriented once returning home and that they can properly care for patient. Caregiver thanked CSW for time and reports she will keep son updated.       [place]olly Ragnar Waas, [place] [phone]

## 2013-06-30 NOTE — Progress Notes (Signed)
Agree with updated d/c recommendation for SNF.  Zenovia JarredKati Georjean Toya, PT, DPT 06/30/2013 Pager: [phone]

## 2013-06-30 NOTE — Care Management Note (Signed)
    Page 1 of 1   06/30/2013     3:09:45 PM   CARE MANAGEMENT NOTE 06/30/2013  Patient:  Greg Young,Greg Young   Account Number:  [id number]  Date Initiated:  06/30/2013  Documentation initiated by:  [facility],Neera Teng  Subjective/Objective Assessment:   80 year old male admitted with fever.     Action/Plan:   From home with care-givers.   Anticipated DC Date:  07/03/2013   Anticipated DC Plan:  HOME W HOME HEALTH SERVICES  In-house referral  Clinical Social Worker      DC Planning Services  CM consult      Choice offered to / List presented to:  C-2 HC POA / Guardian           HH agency  [facility]   Status of service:  In process, will continue to follow Medicare Important Message given?  NA - LOS <3 / Initial given by admissions (If response is "NO", the following Medicare IM given date fields will be blank) Date Medicare IM given:   Date Additional Medicare IM given:    Discharge Disposition:    Per UR Regulation:  Reviewed for med. necessity/level of care/duration of stay  If discussed at Long Length of Stay Meetings, dates discussed:    Comments:  06/30/13 Algernon HuxleyUTH Calissa Swenor RN BSN Spoke with Drue Flirtonnie Cobb care giver. She stated [facility], [facility] use [facility]. Kristen with [facility] has been made aware.

## 2013-06-30 NOTE — Progress Notes (Signed)
[facility] PROGRESS NOTE    Greg Young UJW:[id number] DOB: June 26, 1921 DOA: 06/26/2013 PCP: Georgianne Fick, MD  HPI/Brief narrative 80 y.o. male with history of hypertension, moderate dementia, hard of hearing, resides with his spouse at home and has home care services taking care of him in the morning and by themselves at night. His wife was recently hospitalized and then transferred to rehabilitation and has been living alone since. He presented with flulike symptoms and was admitted for sepsis possibly secondary to community-acquired pneumonia.  Assessment/Plan:  Sepsis  - Secondary to flulike illness and possible community-acquired pneumonia  - He was admitted to step down unit secondary to soft blood pressures for close monitoring and management  - Blood cultures x2: Negative to date. Sputum culture pending. Influenza panel PCR negative. Urine streptococcal antigen positive. - Clinically improved. Sepsis resolved.  Flulike illness  - Influenza panel PCR negative. DC droplet isolation and Tamiflu.  Streptococcal pneumoniae community-acquired pneumonia  - Workup as above. Continue IV Rocephin and azithromycin. Will need followup chest x-rays to ensure resolution of findings. Transitioned to PO Levaquin on 1/10 but in light of confusion, switched to oral Vantin and azithromycin. Will complete total 7 days of antibiotics. Sputum culture shows Streptococcus pneumoniae.  Acute encephalopathy  - Secondary to acute illness complicating underlying dementia. Patient's mental status had improved after the first 48 hours of admission but has since gradually declined. Likely secondary to dehydration from poor oral intake. Hydrate intravenously. Encourage oral intake. Follow clinically.  Acute renal failure  - Likely secondary to intravascular volume depletion. Resolved after IV fluids but creatinine has again increased to 1.38 likely secondary to poor oral intake. IV fluids and follow  BMP in a.m.  Dehydration  - Secondary to Sepsis. Resolved but again clinically dry secondary to poor oral intake. IV fluids.  Elevated troponin  - POC troponin x1 was elevated but subsequent troponins x3 negative. EKG without acute changes. 2-D echo: Normal EF. Possibly from sepsis.  Dementia/hard of hearing  Hypertension  - was not on maintenance medications at home. Blood pressures rising. Monitor closely and if remain consistently elevated, consider antihypertensives i.e. amlodipine 2.5 mg daily.      Code Status: DO NOT RESUSCITATE Family Communication: Discussed with care giver, Ms. Drue Flirt 1/11 Disposition Plan: Home possibly 1/13. Ms. Allison Quarry indicates that they will be able to provide 24/7 supervision for the first few days after discharge and then will reevaluate. Advised her that if he continues to be confused after that period, he may need SNF.   Consultants:  None  Procedures:  None  Antibiotics:  IV Rocephin and azithromycin 1/8 > 1/9  Oral levofloxacin 1/10 > 1/11  Oral Vantin and azithromycin 1/11 >  Subjective: Pleasantly confused.  Objective: Filed Vitals:   06/29/13 1434 06/29/13 2134 06/30/13 0559 06/30/13 1018  BP: 118/64 132/70 152/71 51/49  Pulse: 73 66 58   Temp: 98 F (36.7 C) 98.5 F (36.9 C) 98 F (36.7 C)   TempSrc: Oral Oral Oral   Resp: 18 18 18    Height:      Weight:      SpO2: 94% 94% 94%     Intake/Output Summary (Last 24 hours) at 06/30/13 1322 Last data filed at 06/30/13 0926  Gross per 24 hour  Intake    480 ml  Output    600 ml  Net   -120 ml   Filed Weights   06/26/13 1700  Weight: 64.9 kg (143 lb 1.3 oz)  Exam:  General exam: Pleasant elderly lying in bed. Somnolent but easily arousable. Confused. Fidgety and slightly tremulous at times. Mucosa dry. Respiratory system: Few basal crackles but otherwise clear to auscultation. No increased work of breathing. Cardiovascular system: S1 & S2 heard, RRR. No  JVD, murmurs, gallops, clicks or pedal edema.  Gastrointestinal system: Abdomen is nondistended, soft and nontender. Normal bowel sounds heard. Central nervous system: Somnolent but easily arousable and oriented to self only . No focal neurological deficits. Extremities: Symmetric 5 x 5 power.   Data Reviewed: Basic Metabolic Panel:  Recent Labs Lab 06/26/13 1150 06/27/13 0449 06/30/13 1019  NA 137 140 144  K 4.0 3.7 3.9  CL 101 104 108  CO2 20 22 25   GLUCOSE 120* 110* 116*  BUN 24* 17 24*  CREATININE 1.47* 1.07 1.38*  CALCIUM 8.4 8.3* 8.8   Liver Function Tests:  Recent Labs Lab 06/26/13 1150  AST 20  ALT 9  ALKPHOS 45  BILITOT 0.7  PROT 6.1  ALBUMIN 2.8*   No results found for this basename: LIPASE, AMYLASE,  in the last 168 hours No results found for this basename: AMMONIA,  in the last 168 hours CBC:  Recent Labs Lab 06/26/13 1150 06/27/13 0449 06/30/13 1019  WBC 9.7 8.5 6.8  NEUTROABS 8.3*  --   --   HGB 12.1* 11.8* 12.7*  HCT 36.3* 35.1* 37.8*  MCV 88.8 88.4 88.7  PLT 169 152 265   Cardiac Enzymes:  Recent Labs Lab 06/26/13 1658 06/27/13 0015 06/27/13 0449  TROPONINI <0.30 <0.30 <0.30   BNP (last 3 results) No results found for this basename: PROBNP,  in the last 8760 hours CBG: No results found for this basename: GLUCAP,  in the last 168 hours  Recent Results (from the past 240 hour(s))  CULTURE, BLOOD (ROUTINE X 2)     Status: None   Collection Time    06/26/13 11:50 AM      Result Value Range Status   Specimen Description BLOOD RIGHT ANTECUBITAL   Final   Special Requests BOTTLES DRAWN AEROBIC AND ANAEROBIC   Final   Culture  Setup Time     Final   Value: 06/26/2013 14:04     Performed at [facility]   Culture     Final   Value:        BLOOD CULTURE RECEIVED NO GROWTH TO DATE CULTURE WILL BE HELD FOR 5 DAYS BEFORE ISSUING A FINAL NEGATIVE REPORT     Performed at [facility]   Report Status PENDING    Incomplete  CULTURE, BLOOD (ROUTINE X 2)     Status: None   Collection Time    06/26/13 11:53 AM      Result Value Range Status   Specimen Description BLOOD LEFT HAND    Final   Special Requests BOTTLES DRAWN AEROBIC AND ANAEROBIC   Final   Culture  Setup Time     Final   Value: 06/26/2013 14:04     Performed at [facility]   Culture     Final   Value:        BLOOD CULTURE RECEIVED NO GROWTH TO DATE CULTURE WILL BE HELD FOR 5 DAYS BEFORE ISSUING A FINAL NEGATIVE REPORT     Performed at [facility]   Report Status PENDING   Incomplete  MRSA PCR SCREENING     Status: None   Collection Time    06/26/13  5:07 PM  Result Value Range Status   MRSA by PCR NEGATIVE  NEGATIVE Final   Comment:            The GeneXpert MRSA Assay (FDA     approved for NASAL specimens     only), is one component of a     comprehensive MRSA colonization     surveillance program. It is not     intended to diagnose MRSA     infection nor to guide or     monitor treatment for     MRSA infections.  CULTURE, EXPECTORATED SPUTUM-ASSESSMENT     Status: None   Collection Time    06/26/13 11:04 PM      Result Value Range Status   Specimen Description SPUTUM   Final   Special Requests NONE   Final   Sputum evaluation     Final   Value: THIS SPECIMEN IS ACCEPTABLE. RESPIRATORY CULTURE REPORT TO FOLLOW.   Report Status 06/26/2013 FINAL   Final  CULTURE, RESPIRATORY (NON-EXPECTORATED)     Status: None   Collection Time    06/26/13 11:04 PM      Result Value Range Status   Specimen Description SPUTUM   Final   Special Requests NONE   Final   Gram Stain     Final   Value: ABUNDANT WBC PRESENT, PREDOMINANTLY PMN     RARE SQUAMOUS EPITHELIAL CELLS PRESENT     RARE GRAM POSITIVE COCCI     IN PAIRS     Performed at [facility]   Culture     Final   Value: MODERATE STREPTOCOCCUS PNEUMONIAE     Performed at [facility]   Report Status 06/29/2013 FINAL   Final   Organism  ID, Bacteria STREPTOCOCCUS PNEUMONIAE   Final      Studies: No results found.      Scheduled Meds: . azithromycin  500 mg Oral Daily  . cefpodoxime  200 mg Oral Q12H  . donepezil  10 mg Oral QHS  . enoxaparin (LOVENOX) injection  40 mg Subcutaneous Q24H  . guaiFENesin  600 mg Oral BID  . mirtazapine  30 mg Oral QHS  . sodium chloride  3 mL Intravenous Q12H   Continuous Infusions: . sodium chloride 75 mL/hr at 06/30/13 1320    Principal Problem:   Sepsis Active Problems:   CAP (community acquired pneumonia)   Flu-like symptoms   Hypertension   Hard of hearing   Dementia   Acute encephalopathy   Elevated troponin   Dehydration   Acute renal failure    Time spent: 45 minutes    Yarissa Reining, MD, FACP, FHM. [facility] Pager [phone]  If 7PM-7AM, please contact night-coverage [email or web address] Password [email or web address] 06/30/2013, 1:22 PM    LOS: 4 days

## 2013-07-01 LAB — BASIC METABOLIC PANEL
BUN: 21 mg/dL (ref 6–23)
CHLORIDE: 106 meq/L (ref 96–112)
CO2: 26 mEq/L (ref 19–32)
Calcium: 8.7 mg/dL (ref 8.4–10.5)
Creatinine, Ser: 1.23 mg/dL (ref 0.50–1.35)
GFR calc non Af Amer: 50 mL/min — ABNORMAL LOW (ref >90)
GFR, EST AFRICAN AMERICAN: 58 mL/min — AB (ref >90)
Glucose, Bld: 100 mg/dL — ABNORMAL HIGH (ref 70–99)
Potassium: 4 mEq/L (ref 3.7–5.3)
SODIUM: 142 meq/L (ref 137–147)

## 2013-07-01 MED ORDER — CEFPODOXIME PROXETIL 200 MG PO TABS: 200.0000 mg | ORAL_TABLET | Freq: Two times a day (BID) | ORAL | Status: DC

## 2013-07-01 MED ORDER — GUAIFENESIN-DM 100-10 MG/5ML PO SYRP: 5.0000 mL | ORAL_SOLUTION | ORAL | Status: DC | PRN

## 2013-07-01 MED ORDER — GUAIFENESIN ER 600 MG PO TB12: 600.0000 mg | ORAL_TABLET | Freq: Two times a day (BID) | ORAL | Status: DC

## 2013-07-01 MED ORDER — AZITHROMYCIN 500 MG PO TABS: 500.0000 mg | ORAL_TABLET | Freq: Every day | ORAL | Status: DC

## 2013-07-01 NOTE — Progress Notes (Signed)
Notified Drue Flirtonnie Cobb regarding [facility].  She will be coming in approximately one hour, bringing clothes for patient to wear home.

## 2013-07-01 NOTE — Discharge Summary (Addendum)
Physician Discharge Summary  Greg Young:[id number] DOB: 04/12/1922 DOA: 06/26/2013  PCP: Georgianne Fick, MD  Admit date: 06/26/2013 Discharge date: 07/01/2013  Time spent: Less than 30 minutes  Recommendations for Outpatient Follow-up:  1. Dr. Georgianne Fick, PCP in 1 week with repeat labs (CBC & BMP). 2. Home Health PT. As discussed with care giver [name] several times, they will be able to provide 24/7 supervision and care al tleast for next few days. Family may have to consider SNF if he does not return to baseline. 3. Followup chest x-ray in 4-6 weeks to ensure resolution of pneumonia findings. 4. Followup final results of blood cultures that were sent from the hospital.  Discharge Diagnoses:  Principal Problem:   Sepsis Active Problems:   CAP (community acquired pneumonia)   Flu-like symptoms   Hypertension   Hard of hearing   Dementia   Acute encephalopathy   Elevated troponin   Dehydration   Acute renal failure   Discharge Condition: Improved & Stable  Diet recommendation: Heart healthy diet.  Filed Weights   06/26/13 1700  Weight: 64.9 kg (143 lb 1.3 oz)    History of present illness:  80 y.o. male with history of hypertension, moderate dementia, hard of hearing, resides with his spouse at home and has home care services taking care of him in the morning and by themselves at night. His wife was recently hospitalized and then transferred to rehabilitation and has been living alone since. He presented with flulike symptoms and was admitted for sepsis possibly secondary to community-acquired pneumonia.  Hospital Course:   Sepsis  - Secondary to flulike illness and possible community-acquired pneumonia  - He was admitted to step down unit secondary to soft blood pressures for close monitoring and management  - Blood cultures x2: Negative to date. Influenza panel PCR negative. Urine streptococcal antigen positive.  - Clinically improved. Sepsis  resolved.   Flulike illness  - Influenza panel PCR negative. DC droplet isolation and Tamiflu.   Streptococcal pneumoniae community-acquired pneumonia  - Workup as above.  Patient was initially treated with which was subsequently switched to oral Vantin and azithromycin to complete total 7 days treatment. Will need followup chest x-rays to ensure resolution of findings. Sputum culture shows Streptococcus pneumoniae.   Acute encephalopathy  - Secondary to acute illness complicating underlying dementia. Patient's mental status had improved after the first 48 hours of admission but gradually declined yesterday . Likely secondary to dehydration from poor oral intake. Patient was hydrated intravenously and his mental status is again improved and probably back to baseline.   Acute renal failure  - Likely secondary to intravascular volume depletion. Resolved after IV fluids but creatinine had again increased to 1.38  on 1/12 likely secondary to poor oral intake. Improved after IV fluids. Patient is at high risk for recurrent dehydration & acute renal failure and recommend encouraging by mouth liquids.  Dehydration  - Secondary to Sepsis. As above.   Elevated troponin  - POC troponin x1 was elevated but subsequent troponins x3 negative. EKG without acute changes. 2-D echo: Normal EF. Possibly from sepsis.   Dementia/hard of hearing   Hypertension  - was not on maintenance medications at home. Blood pressures rising. Monitor closely and if remain consistently elevated, consider antihypertensives i.e. amlodipine 2.5 mg daily.   DO NOT RESUSCITATE   Consultations:   None   Procedures:   None     Discharge Exam:  Complaints:  patient denies complaints and asking to go  home.   Filed Vitals:   06/30/13 1018 06/30/13 1442 06/30/13 2200 07/01/13 0545  BP: 51/49 104/58 124/70 176/70  Pulse:  91 62 59  Temp:  98.1 F (36.7 C) 98.5 F (36.9 C) 98.4 F (36.9 C)  TempSrc:  Oral Oral Oral   Resp:  20 18 18   Height:      Weight:      SpO2:  99% 96% 93%    General exam: patient looks much better than he did yesterday. Alert, sitting up in bed comfortably, smiling and eating meal by himself. Respiratory system: Few basal crackles but otherwise clear to auscultation. No increased work of breathing.  Cardiovascular system: S1 & S2 heard, RRR. No JVD, murmurs, gallops, clicks or pedal edema.  Gastrointestinal system: Abdomen is nondistended, soft and nontender. Normal bowel sounds heard.  Central nervous system: Alert and oriented to self and partly to place.. No focal neurological deficits.  Extremities: Symmetric 5 x 5 power.   Discharge Instructions      Discharge Orders   Future Orders Complete By Expires   Call MD for:  difficulty breathing, headache or visual disturbances  As directed    Call MD for:  extreme fatigue  As directed    Call MD for:  persistant dizziness or light-headedness  As directed    Call MD for:  temperature >100.4  As directed    Call MD for:  As directed    Comments:     Altered mental status/confusion.   Diet - low sodium heart healthy  As directed    Discharge instructions  As directed    Comments:     Encourage adequate oral fluid intake.   Increase activity slowly  As directed        Medication List         acetaminophen 500 MG tablet  Commonly known as:  TYLENOL  Take 1,000 mg by mouth every 6 (six) hours as needed.     azithromycin 500 MG tablet  Commonly known as:  ZITHROMAX  Take 1 tablet (500 mg total) by mouth daily.     cefpodoxime 200 MG tablet  Commonly known as:  VANTIN  Take 1 tablet (200 mg total) by mouth every 12 (twelve) hours.     donepezil 10 MG tablet  Commonly known as:  ARICEPT  Take 10 mg by mouth at bedtime.     guaiFENesin 600 MG 12 hr tablet  Commonly known as:  MUCINEX  Take 1 tablet (600 mg total) by mouth 2 (two) times daily.     guaiFENesin-dextromethorphan 100-10 MG/5ML syrup  Commonly  known as:  ROBITUSSIN DM  Take 5 mLs by mouth every 4 (four) hours as needed for cough.     mirtazapine 30 MG tablet  Commonly known as:  REMERON  Take 30 mg by mouth at bedtime.       Follow-up Information   Follow up with [facility], MD. Schedule an appointment as soon as possible for a visit in 1 week. (To be seen with repeat labs (CBC & BMP).)    Specialty:  Internal Medicine   Contact information:   [address] [place] [address] [phone]        The results of significant diagnostics from this hospitalization (including imaging, microbiology, ancillary and laboratory) are listed below for reference.    Significant Diagnostic Studies: Dg Chest 2 View  06/27/2013   CLINICAL DATA:  Chest pain with shortness of breath and cough.  EXAM: CHEST  2 VIEW  COMPARISON:  DG CHEST 1V PORT dated 06/26/2013; DG CHEST 2 VIEW dated 12/28/2008  FINDINGS: The heart size and mediastinal contours are stable with aortic atherosclerosis. There is chronic elevation of the right hemidiaphragm. There are progressively lower lung volumes with increased patchy bibasilar pulmonary opacities. Small pleural effusions are present on the lateral view. No pneumothorax or significant osseous abnormality is seen.  IMPRESSION: Increased bibasilar airspace opacities suspicious for developing pneumonia. There are small bilateral pleural effusions. No definite edema.   Electronically Signed   By: Roxy HorsemanBill  Veazey M.D.   On: 06/27/2013 11:50   Dg Chest Port 1 View  06/26/2013   CLINICAL DATA:  Cough and history of hypertension  EXAM: PORTABLE CHEST - 1 VIEW  COMPARISON:  PA and lateral chest x-ray dated December 28, 2008  FINDINGS: The right hemidiaphragm remains higher than the left. The lungs are slightly less well inflated today. The interstitial markings are more prominent bilaterally but especially on the right. The cardiac silhouette is top-normal in size but stable. The pulmonary vascularity is  not engorged. No pleural effusion is evident. The mediastinum is normal in width. There is mild tortuosity of the descending thoracic aorta.  The nodule demonstrated just above the right hemidiaphragm on the previous study is not visible on the current exam.  IMPRESSION: Mildly increased interstitial markings bilaterally suggest peribronchial cuffing as might be seen with acute bronchitis or early interstitial pneumonia. There is no definite evidence of CHF or of alveolar pneumonia.   Electronically Signed   By: David  Swazil[place]   On: 06/26/2013 12:02    Microbiology: Recent Results (from the past 240 hour(s))  CULTURE, BLOOD (ROUTINE X 2)     Status: None   Collection Time    06/26/13 11:50 AM      Result Value Range Status   Specimen Description BLOOD RIGHT ANTECUBITAL   Final   Special Requests BOTTLES DRAWN AEROBIC AND ANAEROBIC 2ML   Final   Culture  Setup Time     Final   Value: 06/26/2013 14:04     Performed at [facility]   Culture     Final   Value:        BLOOD CULTURE RECEIVED NO GROWTH TO DATE CULTURE WILL BE HELD FOR 5 DAYS BEFORE ISSUING A FINAL NEGATIVE REPORT     Performed at [facility]   Report Status PENDING   Incomplete  CULTURE, BLOOD (ROUTINE X 2)     Status: None   Collection Time    06/26/13 11:53 AM      Result Value Range Status   Specimen Description BLOOD LEFT HAND    Final   Special Requests BOTTLES DRAWN AEROBIC AND ANAEROBIC 2ML   Final   Culture  Setup Time     Final   Value: 06/26/2013 14:04     Performed at [facility]   Culture     Final   Value:        BLOOD CULTURE RECEIVED NO GROWTH TO DATE CULTURE WILL BE HELD FOR 5 DAYS BEFORE ISSUING A FINAL NEGATIVE REPORT     Performed at [facility]   Report Status PENDING   Incomplete  MRSA PCR SCREENING     Status: None   Collection Time    06/26/13  5:07 PM      Result Value Range Status   MRSA by PCR NEGATIVE  NEGATIVE Final   Comment:  The GeneXpert MRSA  Assay (FDA     approved for NASAL specimens     only), is one component of a     comprehensive MRSA colonization     surveillance program. It is not     intended to diagnose MRSA     infection nor to guide or     monitor treatment for     MRSA infections.  CULTURE, EXPECTORATED SPUTUM-ASSESSMENT     Status: None   Collection Time    06/26/13 11:04 PM      Result Value Range Status   Specimen Description SPUTUM   Final   Special Requests NONE   Final   Sputum evaluation     Final   Value: THIS SPECIMEN IS ACCEPTABLE. RESPIRATORY CULTURE REPORT TO FOLLOW.   Report Status 06/26/2013 FINAL   Final  CULTURE, RESPIRATORY (NON-EXPECTORATED)     Status: None   Collection Time    06/26/13 11:04 PM      Result Value Range Status   Specimen Description SPUTUM   Final   Special Requests NONE   Final   Gram Stain     Final   Value: ABUNDANT WBC PRESENT, PREDOMINANTLY PMN     RARE SQUAMOUS EPITHELIAL CELLS PRESENT     RARE GRAM POSITIVE COCCI     IN PAIRS     Performed at [facility]   Culture     Final   Value: MODERATE STREPTOCOCCUS PNEUMONIAE     Performed at [facility]   Report Status 06/29/2013 FINAL   Final   Organism ID, Bacteria STREPTOCOCCUS PNEUMONIAE   Final     Labs: Basic Metabolic Panel:  Recent Labs Lab 06/26/13 1150 06/27/13 0449 06/30/13 1019 07/01/13 0600  NA 137 140 144 142  K 4.0 3.7 3.9 4.0  CL 101 104 108 106  CO2 20 22 25 26   GLUCOSE 120* 110* 116* 100*  BUN 24* 17 24* 21  CREATININE 1.47* 1.07 1.38* 1.23  CALCIUM 8.4 8.3* 8.8 8.7   Liver Function Tests:  Recent Labs Lab 06/26/13 1150  AST 20  ALT 9  ALKPHOS 45  BILITOT 0.7  PROT 6.1  ALBUMIN 2.8*   No results found for this basename: LIPASE, AMYLASE,  in the last 168 hours No results found for this basename: AMMONIA,  in the last 168 hours CBC:  Recent Labs Lab 06/26/13 1150 06/27/13 0449 06/30/13 1019  WBC 9.7 8.5 6.8  NEUTROABS 8.3*  --   --   HGB 12.1* 11.8*  12.7*  HCT 36.3* 35.1* 37.8*  MCV 88.8 88.4 88.7  PLT 169 152 265   Cardiac Enzymes:  Recent Labs Lab 06/26/13 1658 06/27/13 0015 06/27/13 0449  TROPONINI <0.30 <0.30 <0.30   BNP: BNP (last 3 results) No results found for this basename: PROBNP,  in the last 8760 hours CBG: No results found for this basename: GLUCAP,  in the last 168 hours   Signed:  Marcellus Scott, MD, FACP, FHM. [facility] Pager [phone]  If 7PM-7AM, please contact night-coverage [email or web address] Password [email or web address] 07/01/2013, 1:39 PM

## 2013-07-02 LAB — CULTURE, BLOOD (ROUTINE X 2)
Culture: NO GROWTH
Culture: NO GROWTH

## 2015-01-21 IMAGING — CR DG CHEST 1V PORT
1 series · 1 of 1 positions shown · non-contrast
Comparison: PA and lateral chest x-ray dated December 28, 2008

CLINICAL DATA: Cough and history of hypertension

EXAM:
PORTABLE CHEST - 1 VIEW

[AP]
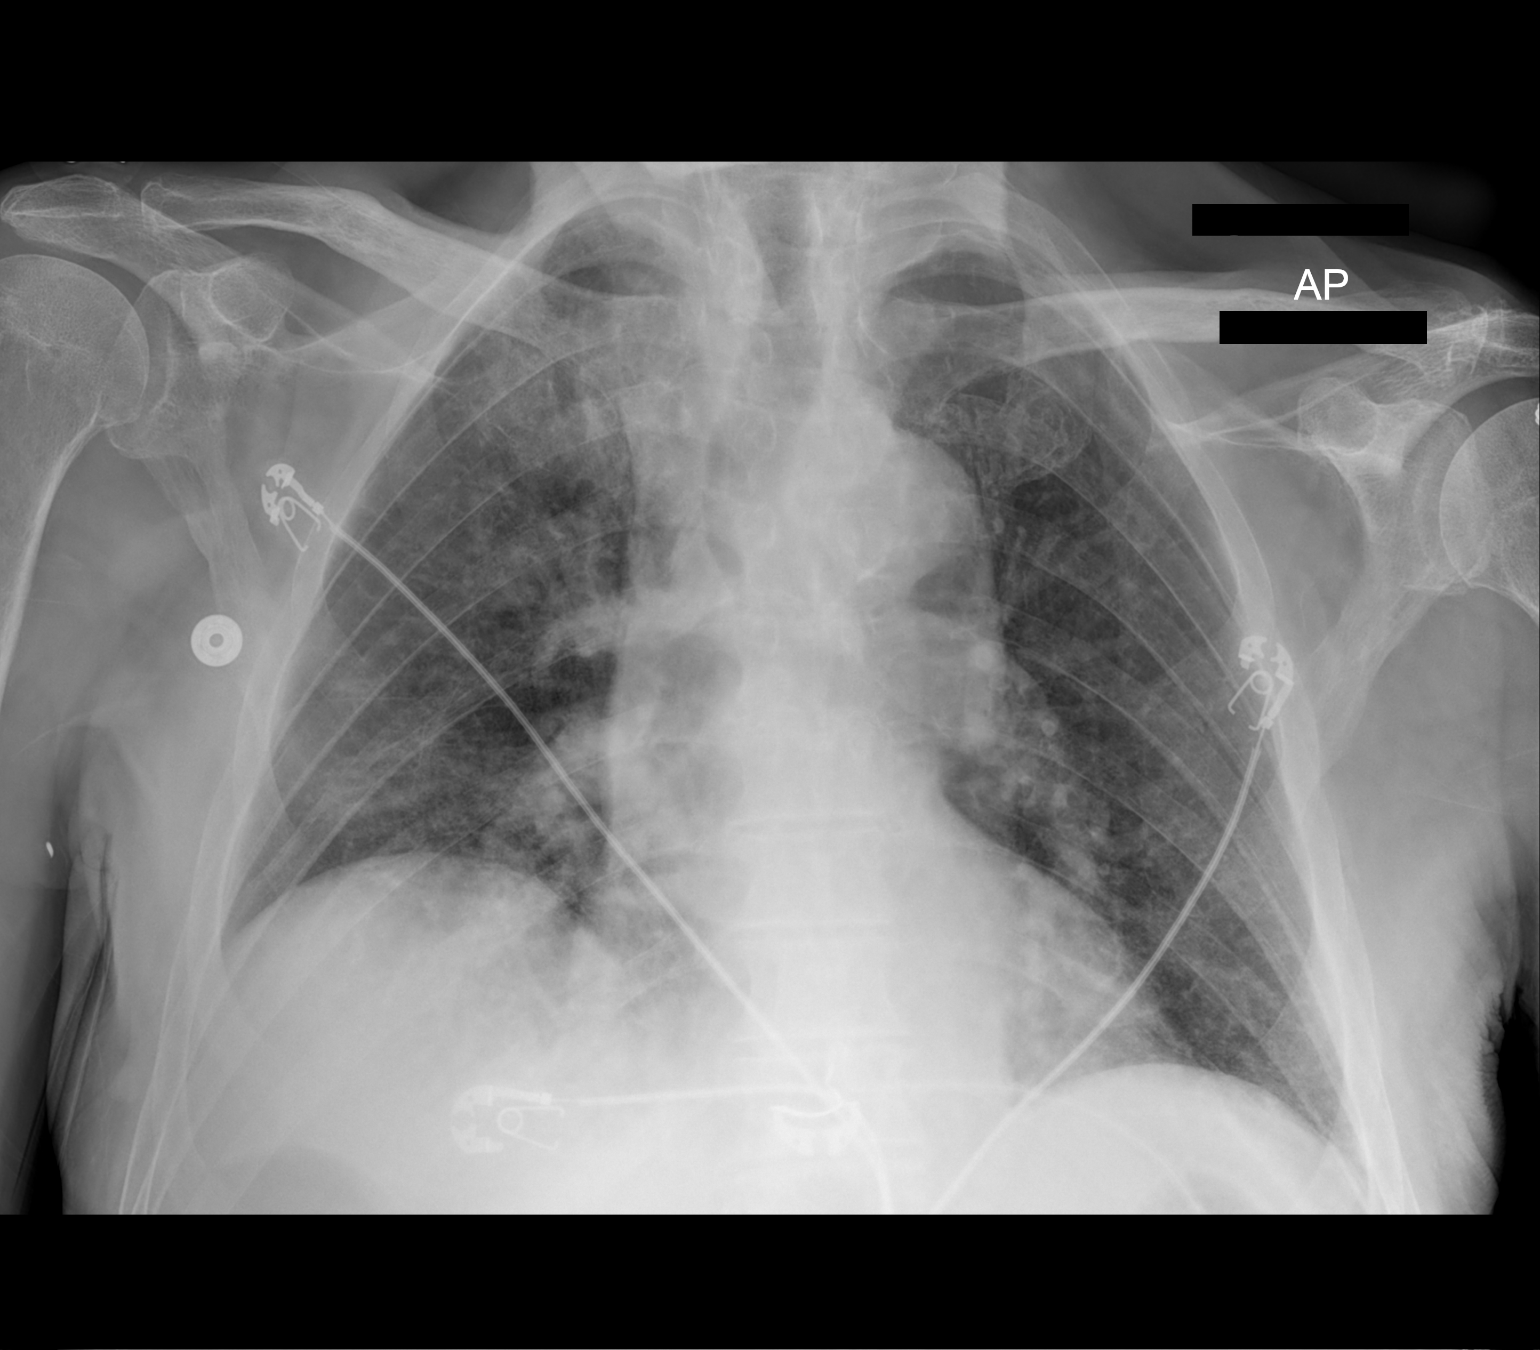

[1 of 1 positions shown; findings below may reference images not displayed]

FINDINGS: The right hemidiaphragm remains higher than the left. The lungs are
slightly less well inflated today. The interstitial markings are
more prominent bilaterally but especially on the right. The cardiac
silhouette is top-normal in size but stable. The pulmonary
vascularity is not engorged. No pleural effusion is evident. The
mediastinum is normal in width. There is mild tortuosity of the
descending thoracic aorta.

The nodule demonstrated just above the right hemidiaphragm on the
previous study is not visible on the current exam.
IMPRESSION: Mildly increased interstitial markings bilaterally suggest
peribronchial cuffing as might be seen with acute bronchitis or
early interstitial pneumonia. There is no definite evidence of CHF
or of alveolar pneumonia.

## 2015-07-23 ENCOUNTER — Encounter (HOSPITAL_COMMUNITY): Payer: Self-pay | Admitting: Emergency Medicine

## 2015-07-23 ENCOUNTER — Emergency Department (HOSPITAL_COMMUNITY): Payer: Medicare HMO

## 2015-07-23 ENCOUNTER — Inpatient Hospital Stay (HOSPITAL_COMMUNITY)
Admission: EM | Admit: 2015-07-23 | Discharge: 2015-07-28 | DRG: 871 | Disposition: A | Discharge status: Hospice | Payer: Medicare HMO | Attending: Internal Medicine | Admitting: Internal Medicine

## 2015-07-23 DIAGNOSIS — L89211 Pressure ulcer of right hip, stage 1: Secondary | ICD-10-CM | POA: Diagnosis present

## 2015-07-23 DIAGNOSIS — Z87891 Personal history of nicotine dependence: Secondary | ICD-10-CM

## 2015-07-23 DIAGNOSIS — L8990 Pressure ulcer of unspecified site, unspecified stage: Secondary | ICD-10-CM

## 2015-07-23 DIAGNOSIS — A4151 Sepsis due to Escherichia coli [E. coli]: Principal | ICD-10-CM | POA: Diagnosis present

## 2015-07-23 DIAGNOSIS — R64 Cachexia: Secondary | ICD-10-CM | POA: Diagnosis present

## 2015-07-23 DIAGNOSIS — N390 Urinary tract infection, site not specified: Secondary | ICD-10-CM

## 2015-07-23 DIAGNOSIS — E43 Unspecified severe protein-calorie malnutrition: Secondary | ICD-10-CM | POA: Diagnosis present

## 2015-07-23 DIAGNOSIS — R0600 Dyspnea, unspecified: Secondary | ICD-10-CM | POA: Insufficient documentation

## 2015-07-23 DIAGNOSIS — J9601 Acute respiratory failure with hypoxia: Secondary | ICD-10-CM | POA: Diagnosis present

## 2015-07-23 DIAGNOSIS — Z22322 Carrier or suspected carrier of Methicillin resistant Staphylococcus aureus: Secondary | ICD-10-CM

## 2015-07-23 DIAGNOSIS — N170 Acute kidney failure with tubular necrosis: Secondary | ICD-10-CM | POA: Diagnosis present

## 2015-07-23 DIAGNOSIS — B371 Pulmonary candidiasis: Secondary | ICD-10-CM | POA: Diagnosis present

## 2015-07-23 DIAGNOSIS — E875 Hyperkalemia: Secondary | ICD-10-CM

## 2015-07-23 DIAGNOSIS — I714 Abdominal aortic aneurysm, without rupture: Secondary | ICD-10-CM | POA: Diagnosis present

## 2015-07-23 DIAGNOSIS — R4182 Altered mental status, unspecified: Secondary | ICD-10-CM | POA: Diagnosis not present

## 2015-07-23 DIAGNOSIS — G9340 Encephalopathy, unspecified: Secondary | ICD-10-CM

## 2015-07-23 DIAGNOSIS — N179 Acute kidney failure, unspecified: Secondary | ICD-10-CM

## 2015-07-23 DIAGNOSIS — Z0189 Encounter for other specified special examinations: Secondary | ICD-10-CM

## 2015-07-23 DIAGNOSIS — I4891 Unspecified atrial fibrillation: Secondary | ICD-10-CM | POA: Diagnosis present

## 2015-07-23 DIAGNOSIS — H9190 Unspecified hearing loss, unspecified ear: Secondary | ICD-10-CM | POA: Diagnosis present

## 2015-07-23 DIAGNOSIS — R6520 Severe sepsis without septic shock: Secondary | ICD-10-CM

## 2015-07-23 DIAGNOSIS — I10 Essential (primary) hypertension: Secondary | ICD-10-CM

## 2015-07-23 DIAGNOSIS — R7989 Other specified abnormal findings of blood chemistry: Secondary | ICD-10-CM

## 2015-07-23 DIAGNOSIS — Z4659 Encounter for fitting and adjustment of other gastrointestinal appliance and device: Secondary | ICD-10-CM

## 2015-07-23 DIAGNOSIS — R6889 Other general symptoms and signs: Secondary | ICD-10-CM

## 2015-07-23 DIAGNOSIS — R778 Other specified abnormalities of plasma proteins: Secondary | ICD-10-CM

## 2015-07-23 DIAGNOSIS — Z515 Encounter for palliative care: Secondary | ICD-10-CM | POA: Diagnosis not present

## 2015-07-23 DIAGNOSIS — Z66 Do not resuscitate: Secondary | ICD-10-CM | POA: Diagnosis not present

## 2015-07-23 DIAGNOSIS — E870 Hyperosmolality and hypernatremia: Secondary | ICD-10-CM

## 2015-07-23 DIAGNOSIS — Z681 Body mass index (BMI) 19 or less, adult: Secondary | ICD-10-CM

## 2015-07-23 DIAGNOSIS — F0390 Unspecified dementia without behavioral disturbance: Secondary | ICD-10-CM | POA: Diagnosis present

## 2015-07-23 DIAGNOSIS — A419 Sepsis, unspecified organism: Secondary | ICD-10-CM

## 2015-07-23 DIAGNOSIS — J189 Pneumonia, unspecified organism: Secondary | ICD-10-CM

## 2015-07-23 DIAGNOSIS — J690 Pneumonitis due to inhalation of food and vomit: Secondary | ICD-10-CM | POA: Diagnosis present

## 2015-07-23 DIAGNOSIS — E860 Dehydration: Secondary | ICD-10-CM

## 2015-07-23 DIAGNOSIS — B9620 Unspecified Escherichia coli [E. coli] as the cause of diseases classified elsewhere: Secondary | ICD-10-CM | POA: Diagnosis present

## 2015-07-23 DIAGNOSIS — G92 Toxic encephalopathy: Secondary | ICD-10-CM | POA: Diagnosis present

## 2015-07-23 LAB — COMPREHENSIVE METABOLIC PANEL
ALK PHOS: 85 U/L (ref 38–126)
ALT: 42 U/L (ref 17–63)
ANION GAP: 19 — AB (ref 5–15)
AST: 45 U/L — ABNORMAL HIGH (ref 15–41)
Albumin: 2.4 g/dL — ABNORMAL LOW (ref 3.5–5.0)
BUN: 163 mg/dL — ABNORMAL HIGH (ref 6–20)
CO2: 19 mmol/L — AB (ref 22–32)
CREATININE: 5.32 mg/dL — AB (ref 0.61–1.24)
Calcium: 9.3 mg/dL (ref 8.9–10.3)
Chloride: 114 mmol/L — ABNORMAL HIGH (ref 101–111)
GFR, EST AFRICAN AMERICAN: 10 mL/min — AB (ref >60)
GFR, EST NON AFRICAN AMERICAN: 8 mL/min — AB (ref >60)
Glucose, Bld: 139 mg/dL — ABNORMAL HIGH (ref 65–99)
Potassium: 5.9 mmol/L — ABNORMAL HIGH (ref 3.5–5.1)
SODIUM: 152 mmol/L — AB (ref 135–145)
Total Bilirubin: 0.9 mg/dL (ref 0.3–1.2)
Total Protein: 8.1 g/dL (ref 6.5–8.1)

## 2015-07-23 LAB — URINALYSIS, ROUTINE W REFLEX MICROSCOPIC
BILIRUBIN URINE: NEGATIVE
GLUCOSE, UA: NEGATIVE mg/dL
KETONES UR: NEGATIVE mg/dL
NITRITE: NEGATIVE
PH: 5.5 (ref 5.0–8.0)
PROTEIN: 100 mg/dL — AB
Specific Gravity, Urine: 1.015 (ref 1.005–1.030)

## 2015-07-23 LAB — CBC
HCT: 43.3 % (ref 39.0–52.0)
HEMOGLOBIN: 13.9 g/dL (ref 13.0–17.0)
MCH: 28.5 pg (ref 26.0–34.0)
MCHC: 32.1 g/dL (ref 30.0–36.0)
MCV: 88.9 fL (ref 78.0–100.0)
PLATELETS: 379 10*3/uL (ref 150–400)
RBC: 4.87 MIL/uL (ref 4.22–5.81)
RDW: 15.8 % — ABNORMAL HIGH (ref 11.5–15.5)
WBC: 14 10*3/uL — ABNORMAL HIGH (ref 4.0–10.5)

## 2015-07-23 LAB — I-STAT CG4 LACTIC ACID, ED
LACTIC ACID, VENOUS: 4.64 mmol/L — AB (ref 0.5–2.0)
Lactic Acid, Venous: 3.35 mmol/L (ref 0.5–2.0)

## 2015-07-23 LAB — URINE MICROSCOPIC-ADD ON

## 2015-07-23 MED ORDER — SODIUM CHLORIDE 0.9 % IV BOLUS (SEPSIS)
1000.0000 mL | Freq: Once | INTRAVENOUS | Status: AC
  Administered 2015-07-23: 1000 mL via INTRAVENOUS

## 2015-07-23 MED ORDER — DEXTROSE 5 % IV SOLN: 1.0000 g | Freq: Once | INTRAVENOUS | Status: DC

## 2015-07-23 MED ORDER — AZITHROMYCIN 500 MG IV SOLR
500.0000 mg | INTRAVENOUS | Status: DC
  Administered 2015-07-23: 500 mg via INTRAVENOUS
  Filled 2015-07-23: qty 500

## 2015-07-23 MED ORDER — DEXTROSE 5 % IV SOLN: 500.0000 mg | Freq: Once | INTRAVENOUS | Status: DC

## 2015-07-23 MED ORDER — DEXTROSE 5 % IV SOLN
1.0000 g | INTRAVENOUS | Status: DC
  Administered 2015-07-23: 1 g via INTRAVENOUS
  Filled 2015-07-23: qty 10

## 2015-07-23 NOTE — Consult Note (Signed)
Pharmacy Antibiotic Note  Greg Young is a 82 y.o. male admitted on 07/23/2015 with pneumonia w/ elevated lactic acid.  Pharmacy has been consulted for ceftriaxone and azithromycin dosing.  Pt had been on an antibiotic PTA which he just finished. Caretaker is unsure which antibiotic.  Plan: Ceftriaxone 1g IV q 24h Azithromycin 750 mg IV q24h F/u cultures, LOT, clinical progression    Temp (24hrs), Avg:98.5 F (36.9 C), Min:98.5 F (36.9 C), Max:98.5 F (36.9 C)   Recent Labs Lab 07/23/15 2135  LATICACIDVEN 4.64*    CrCl cannot be calculated (Unknown ideal weight.).    No Known Allergies  Antimicrobials this admission: Ceftriaxone  2/3 >>  Azithro 2/3 >>   Dose adjustments this admission: n/a  Microbiology results: 2/3 BCx: sent 2/3 UCx: sent   Thank you for allowing pharmacy to be a part of this patient's care.  Greggory Stallion, PharmD Clinical Pharmacy Resident Pager # [phone] 07/23/2015 9:40 PM    Pharmacy Code Sepsis Protocol  Time of code sepsis page: 2100  Antibiotics delivered at 2104   Were antibiotics ordered at the time of the code sepsis page? Yes Was it required to contact the physician?  Physician not contacted  Physician contacted to order antibiotics for code sepsis  Physician contacted to recommend changing antibiotics  Pharmacy consulted for: ceftriaxone and azithromycin  Anti-infectives    Start     Dose/Rate Route Frequency Ordered Stop   07/23/15 2100  cefTRIAXone (ROCEPHIN) 1 g in dextrose 5 % 50 mL IVPB  Status:  Discontinued     1 g 100 mL/hr over 30 Minutes Intravenous  Once 07/23/15 2054 07/23/15 2059   07/23/15 2100  azithromycin (ZITHROMAX) 500 mg in dextrose 5 % 250 mL IVPB  Status:  Discontinued     500 mg 250 mL/hr over 60 Minutes Intravenous  Once 07/23/15 2054 07/23/15 2059   07/23/15 2100  azithromycin (ZITHROMAX) 500 mg in dextrose 5 % 250 mL IVPB     500 mg 250 mL/hr over 60 Minutes Intravenous Every 24  hours 07/23/15 2059     07/23/15 2100  cefTRIAXone (ROCEPHIN) 1 g in dextrose 5 % 50 mL IVPB     1 g 100 mL/hr over 30 Minutes Intravenous Every 24 hours 07/23/15 2059          Nurse education provided:  Minutes left to administer antibiotics to achieve 1 hour goal  Correct order of antibiotic administration  Antibiotic Y-site compatibilities    Greggory Stallion, PharmD Clinical Pharmacy Resident Pager # [phone] 07/23/2015 9:06 PM

## 2015-07-23 NOTE — ED Provider Notes (Signed)
CSN: [id number]     Arrival date & time 07/23/15  2019 History   First MD Initiated Contact with Patient 07/23/15 2031     Chief Complaint  Patient presents with  . Altered Mental Status     (Consider location/radiation/quality/duration/timing/severity/associated sxs/prior Treatment) HPI History is per the patient's caregiver. At baseline, the patient is capable of performing in-home ADLs such as dressing and self feeding. He usually responds appropriately to commands. His caregiver reports that 3 days ago she helped him to the bathroom and he suddenly became very weak. He needed additional assistance which was atypical. The next day he had very poor oral intake. Today, he has become confused and not responding appropriately to commands. He has become short of breath in the past several hours. Of note, the patient had been taking an antibiotic which he just finished. His caregiver is not sure what the antibiotic was but reports it was for cold symptoms. He has not been having vomiting or diarrhea. She reports he has not been having any pain complaints. Past Medical History  Diagnosis Date  . Hypertension   . Hard of hearing   . Dementia     "moderate dementia"   History reviewed. No pertinent past surgical history. History reviewed. No pertinent family history. Social History  Substance Use Topics  . Smoking status: Former Games developer  . Smokeless tobacco: Former Neurosurgeon    Types: Chew  . Alcohol Use: No    Review of Systems Unable to perform review of systems due to patient condition level V caveat acute illness and dementia.   Allergies  Review of patient's allergies indicates no known allergies.  Home Medications   Prior to Admission medications   Medication Sig Start Date End Date Taking? Authorizing Provider  calcium carbonate (OSCAL) 1500 (600 Ca) MG TABS tablet Take 1,500 mg by mouth daily with breakfast.   Yes Historical Provider, MD  docusate sodium (COLACE) 100 MG capsule  Take 100-200 mg by mouth 3 (three) times daily. Takes 2 caps in am, 1 cap at lunch, 2 caps in evening   Yes Historical Provider, MD  Melatonin 5 MG TABS Take 5 mg by mouth at bedtime.   Yes Historical Provider, MD  azithromycin (ZITHROMAX) 500 MG tablet Take 1 tablet (500 mg total) by mouth daily. 07/01/13   Elease Etienne, MD  cefpodoxime (VANTIN) 200 MG tablet Take 1 tablet (200 mg total) by mouth every 12 (twelve) hours. 07/01/13   Elease Etienne, MD  guaiFENesin (MUCINEX) 600 MG 12 hr tablet Take 1 tablet (600 mg total) by mouth 2 (two) times daily. 07/01/13   Elease Etienne, MD  guaiFENesin-dextromethorphan (ROBITUSSIN DM) 100-10 MG/5ML syrup Take 5 mLs by mouth every 4 (four) hours as needed for cough. 07/01/13   Elease Etienne, MD  PRESCRIPTION MEDICATION Take 1 capsule by mouth 3 (three) times daily. Antibiotic-unsure of kind 07/16/15   Historical Provider, MD   BP 103/49 mmHg  Pulse 73  Temp(Src) 99 F (37.2 C) (Rectal)  Resp 29  SpO2 100% Physical Exam  Constitutional:  Patient is cachectic and ill in appearance.  HENT:  Head: Normocephalic and atraumatic.  Mucous membranes dry. Mucous membranes dry.  Eyes:  Eyes are sunken, pupils are symmetric.  Cardiovascular:  Heart is regular with frequent ectopy. No gross rub murmur gallop.  Pulmonary/Chest:  Moderate increased work of breathing. Patient has significantly decreased breath sounds in the right base with crackles. Adequate air flow on the left.  Abdominal:  Patient does not endorse tenderness to palpation of the abdomen. Abdomen feels mildly firm. Not appreciate specific mass.  Musculoskeletal:  Extremities are cachectic. He does not have peripheral edema. No deformity. No cellulitis.  Neurological:  Patient is confused and somnolent. He is seated in a upright position in the stretcher. He is doing some picking at the blankets. I placed my fingers and his hands he does grip.  Skin:  Skin is pale and cool. Legs show  some mottling around the knees.    ED Course  Procedures (including critical care time) CRITICAL CARE Performed by: Arby Barrette   Total critical care time: 45 minutes  Critical care time was exclusive of separately billable procedures and treating other patients.  Critical care was necessary to treat or prevent imminent or life-threatening deterioration.  Critical care was time spent personally by me on the following activities: development of treatment plan with patient and/or surrogate as well as nursing, discussions with consultants, evaluation of patient's response to treatment, examination of patient, obtaining history from patient or surrogate, ordering and performing treatments and interventions, ordering and review of laboratory studies, ordering and review of radiographic studies, pulse oximetry and re-evaluation of patient's condition. Labs Review Labs Reviewed  COMPREHENSIVE METABOLIC PANEL - Abnormal; Notable for the following:    Sodium 152 (*)    Potassium 5.9 (*)    Chloride 114 (*)    CO2 19 (*)    Glucose, Bld 139 (*)    BUN 163 (*)    Creatinine, Ser 5.32 (*)    Albumin 2.4 (*)    AST 45 (*)    GFR calc non Af Amer 8 (*)    GFR calc Af Amer 10 (*)    Anion gap 19 (*)    All other components within normal limits  CBC - Abnormal; Notable for the following:    WBC 14.0 (*)    RDW 15.8 (*)    All other components within normal limits  URINALYSIS, ROUTINE W REFLEX MICROSCOPIC (NOT AT [facility]) - Abnormal; Notable for the following:    APPearance TURBID (*)    Hgb urine dipstick LARGE (*)    Protein, ur 100 (*)    Leukocytes, UA LARGE (*)    All other components within normal limits  URINE MICROSCOPIC-ADD ON - Abnormal; Notable for the following:    Squamous Epithelial / LPF 0-5 (*)    Bacteria, UA RARE (*)    All other components within normal limits  URINALYSIS, ROUTINE W REFLEX MICROSCOPIC (NOT AT [facility]) - Abnormal; Notable for the following:    Color,  Urine BROWN (*)    APPearance TURBID (*)    Hgb urine dipstick LARGE (*)    Bilirubin Urine MODERATE (*)    Ketones, ur 15 (*)    Protein, ur 100 (*)    Nitrite POSITIVE (*)    Leukocytes, UA LARGE (*)    All other components within normal limits  URINE MICROSCOPIC-ADD ON - Abnormal; Notable for the following:    Squamous Epithelial / LPF 0-5 (*)    Bacteria, UA MANY (*)    All other components within normal limits  I-STAT CG4 LACTIC ACID, ED - Abnormal; Notable for the following:    Lactic Acid, Venous 4.64 (*)    All other components within normal limits  I-STAT CG4 LACTIC ACID, ED - Abnormal; Notable for the following:    Lactic Acid, Venous 3.35 (*)    All other components within  normal limits  CULTURE, BLOOD (ROUTINE X 2)  CULTURE, BLOOD (ROUTINE X 2)  URINE CULTURE  URINE CULTURE  CBG MONITORING, ED    Imaging Review Dg Chest Port 1 View  07/23/2015  CLINICAL DATA:  Sepsis. EXAM: PORTABLE CHEST 1 VIEW COMPARISON:  06/27/2013 FINDINGS: Chronic elevation of right hemidiaphragm. Cardiomediastinal contours are unchanged. Bibasilar opacities are again seen, with interval increase particularly on the left. No pulmonary edema. No large pleural effusion or pneumothorax. IMPRESSION: Bibasilar opacities, with interval increase from prior exam from two years prior. This may reflect atelectasis or pneumonia. Given distribution, aspiration is considered. Electronically Signed   By: Rubye Oaks M.D.   On: 07/23/2015 22:53   I have personally reviewed and evaluated these images and lab results as part of my medical decision-making.   EKG Interpretation   Date/Time:  Friday July 23 2015 22:04:19 EST Ventricular Rate:  84 PR Interval:    QRS Duration: 114 QT Interval:  378 QTC Calculation: 447 R Axis:   -4 Text Interpretation:  Borderline intraventricular conduction delay  Confirmed by Donnald Garre, MD, Lebron Conners [phone]) on 07/24/2015 1:17:27 AM      MDM   Final diagnoses:   Sepsis, due to unspecified organism [facility])  UTI (lower urinary tract infection)  Acute renal failure, unspecified acute renal failure type [facility])   Patient presents severely ill. At this point findings are consistent with sepsis of UTI as well as pneumonia. This is protocol has been initiated.    Arby Barrette, MD 07/24/15 [phone]

## 2015-07-23 NOTE — ED Notes (Signed)
Pt arrives by [facility] for altered mental status. Caregivers report decline in mental faculties x3 days, but today pt became unresponsive. Also state pt has not had food or drink for 3 days. EMS reports lethargy on arrival with response to painful stimuli only. Initial BP 82/50. EMS placed 20g in Left wrist. bolus given. Unable to get a good O2 sat, placed pt on 2L. Last vitals 130/80, HR 84 irregular, RR 26. EMS states pt is more alert and moving more than when they arrived.

## 2015-07-24 ENCOUNTER — Encounter (HOSPITAL_COMMUNITY): Payer: Self-pay | Admitting: Family Medicine

## 2015-07-24 ENCOUNTER — Inpatient Hospital Stay (HOSPITAL_COMMUNITY): Payer: Medicare HMO

## 2015-07-24 DIAGNOSIS — Z681 Body mass index (BMI) 19 or less, adult: Secondary | ICD-10-CM | POA: Diagnosis not present

## 2015-07-24 DIAGNOSIS — R4182 Altered mental status, unspecified: Secondary | ICD-10-CM | POA: Diagnosis present

## 2015-07-24 DIAGNOSIS — L89211 Pressure ulcer of right hip, stage 1: Secondary | ICD-10-CM | POA: Diagnosis present

## 2015-07-24 DIAGNOSIS — E870 Hyperosmolality and hypernatremia: Secondary | ICD-10-CM | POA: Diagnosis present

## 2015-07-24 DIAGNOSIS — I714 Abdominal aortic aneurysm, without rupture: Secondary | ICD-10-CM | POA: Diagnosis present

## 2015-07-24 DIAGNOSIS — A4151 Sepsis due to Escherichia coli [E. coli]: Secondary | ICD-10-CM | POA: Diagnosis present

## 2015-07-24 DIAGNOSIS — N170 Acute kidney failure with tubular necrosis: Secondary | ICD-10-CM | POA: Diagnosis present

## 2015-07-24 DIAGNOSIS — A419 Sepsis, unspecified organism: Secondary | ICD-10-CM | POA: Diagnosis not present

## 2015-07-24 DIAGNOSIS — E875 Hyperkalemia: Secondary | ICD-10-CM | POA: Diagnosis present

## 2015-07-24 DIAGNOSIS — G92 Toxic encephalopathy: Secondary | ICD-10-CM | POA: Diagnosis present

## 2015-07-24 DIAGNOSIS — N390 Urinary tract infection, site not specified: Secondary | ICD-10-CM | POA: Diagnosis present

## 2015-07-24 DIAGNOSIS — J189 Pneumonia, unspecified organism: Secondary | ICD-10-CM | POA: Diagnosis not present

## 2015-07-24 DIAGNOSIS — N179 Acute kidney failure, unspecified: Secondary | ICD-10-CM | POA: Diagnosis not present

## 2015-07-24 DIAGNOSIS — R0600 Dyspnea, unspecified: Secondary | ICD-10-CM | POA: Diagnosis not present

## 2015-07-24 DIAGNOSIS — H9190 Unspecified hearing loss, unspecified ear: Secondary | ICD-10-CM | POA: Diagnosis present

## 2015-07-24 DIAGNOSIS — B371 Pulmonary candidiasis: Secondary | ICD-10-CM | POA: Diagnosis present

## 2015-07-24 DIAGNOSIS — I4891 Unspecified atrial fibrillation: Secondary | ICD-10-CM | POA: Diagnosis present

## 2015-07-24 DIAGNOSIS — G9340 Encephalopathy, unspecified: Secondary | ICD-10-CM | POA: Diagnosis not present

## 2015-07-24 DIAGNOSIS — Z87891 Personal history of nicotine dependence: Secondary | ICD-10-CM | POA: Diagnosis not present

## 2015-07-24 DIAGNOSIS — Z66 Do not resuscitate: Secondary | ICD-10-CM | POA: Diagnosis not present

## 2015-07-24 DIAGNOSIS — L8990 Pressure ulcer of unspecified site, unspecified stage: Secondary | ICD-10-CM | POA: Diagnosis present

## 2015-07-24 DIAGNOSIS — R6520 Severe sepsis without septic shock: Secondary | ICD-10-CM | POA: Diagnosis not present

## 2015-07-24 DIAGNOSIS — Z515 Encounter for palliative care: Secondary | ICD-10-CM | POA: Diagnosis not present

## 2015-07-24 DIAGNOSIS — F0390 Unspecified dementia without behavioral disturbance: Secondary | ICD-10-CM

## 2015-07-24 DIAGNOSIS — Z22322 Carrier or suspected carrier of Methicillin resistant Staphylococcus aureus: Secondary | ICD-10-CM | POA: Diagnosis not present

## 2015-07-24 DIAGNOSIS — R64 Cachexia: Secondary | ICD-10-CM | POA: Diagnosis present

## 2015-07-24 DIAGNOSIS — J9601 Acute respiratory failure with hypoxia: Secondary | ICD-10-CM | POA: Diagnosis present

## 2015-07-24 DIAGNOSIS — E43 Unspecified severe protein-calorie malnutrition: Secondary | ICD-10-CM | POA: Diagnosis present

## 2015-07-24 DIAGNOSIS — I10 Essential (primary) hypertension: Secondary | ICD-10-CM | POA: Diagnosis present

## 2015-07-24 DIAGNOSIS — J690 Pneumonitis due to inhalation of food and vomit: Secondary | ICD-10-CM | POA: Diagnosis present

## 2015-07-24 DIAGNOSIS — E860 Dehydration: Secondary | ICD-10-CM | POA: Diagnosis present

## 2015-07-24 LAB — COMPREHENSIVE METABOLIC PANEL
ALT: 38 U/L (ref 17–63)
AST: 43 U/L — AB (ref 15–41)
Albumin: 1.8 g/dL — ABNORMAL LOW (ref 3.5–5.0)
Alkaline Phosphatase: 73 U/L (ref 38–126)
Anion gap: 18 — ABNORMAL HIGH (ref 5–15)
BUN: 152 mg/dL — AB (ref 6–20)
CHLORIDE: 118 mmol/L — AB (ref 101–111)
CO2: 18 mmol/L — AB (ref 22–32)
CREATININE: 4.45 mg/dL — AB (ref 0.61–1.24)
Calcium: 8.7 mg/dL — ABNORMAL LOW (ref 8.9–10.3)
GFR calc Af Amer: 12 mL/min — ABNORMAL LOW (ref >60)
GFR, EST NON AFRICAN AMERICAN: 10 mL/min — AB (ref >60)
Glucose, Bld: 160 mg/dL — ABNORMAL HIGH (ref 65–99)
POTASSIUM: 5.3 mmol/L — AB (ref 3.5–5.1)
SODIUM: 154 mmol/L — AB (ref 135–145)
Total Bilirubin: 0.8 mg/dL (ref 0.3–1.2)
Total Protein: 6.6 g/dL (ref 6.5–8.1)

## 2015-07-24 LAB — URINALYSIS, ROUTINE W REFLEX MICROSCOPIC
Glucose, UA: NEGATIVE mg/dL
KETONES UR: 15 mg/dL — AB
NITRITE: POSITIVE — AB
PH: 5.5 (ref 5.0–8.0)
PROTEIN: 100 mg/dL — AB
Specific Gravity, Urine: 1.016 (ref 1.005–1.030)

## 2015-07-24 LAB — URINE MICROSCOPIC-ADD ON

## 2015-07-24 LAB — STREP PNEUMONIAE URINARY ANTIGEN: Strep Pneumo Urinary Antigen: NEGATIVE

## 2015-07-24 LAB — CBC WITH DIFFERENTIAL/PLATELET
BASOS ABS: 0 10*3/uL (ref 0.0–0.1)
Basophils Relative: 0 %
EOS ABS: 0 10*3/uL (ref 0.0–0.7)
EOS PCT: 0 %
HCT: 34.3 % — ABNORMAL LOW (ref 39.0–52.0)
Hemoglobin: 10.8 g/dL — ABNORMAL LOW (ref 13.0–17.0)
LYMPHS ABS: 0.8 10*3/uL (ref 0.7–4.0)
Lymphocytes Relative: 7 %
MCH: 27.8 pg (ref 26.0–34.0)
MCHC: 31.5 g/dL (ref 30.0–36.0)
MCV: 88.2 fL (ref 78.0–100.0)
MONO ABS: 0.3 10*3/uL (ref 0.1–1.0)
Monocytes Relative: 3 %
NEUTROS PCT: 90 %
Neutro Abs: 10.3 10*3/uL — ABNORMAL HIGH (ref 1.7–7.7)
PLATELETS: 299 10*3/uL (ref 150–400)
RBC: 3.89 MIL/uL — AB (ref 4.22–5.81)
RDW: 16 % — AB (ref 11.5–15.5)
WBC MORPHOLOGY: INCREASED
WBC: 11.4 10*3/uL — AB (ref 4.0–10.5)

## 2015-07-24 LAB — MRSA PCR SCREENING: MRSA by PCR: POSITIVE — AB

## 2015-07-24 LAB — TSH: TSH: 2.343 u[IU]/mL (ref 0.350–4.500)

## 2015-07-24 LAB — PREALBUMIN: PREALBUMIN: 10.3 mg/dL — AB (ref 18–38)

## 2015-07-24 LAB — BRAIN NATRIURETIC PEPTIDE: B NATRIURETIC PEPTIDE 5: 136.5 pg/mL — AB (ref 0.0–100.0)

## 2015-07-24 LAB — LACTIC ACID, PLASMA
LACTIC ACID, VENOUS: 2.6 mmol/L — AB (ref 0.5–2.0)
Lactic Acid, Venous: 3.5 mmol/L (ref 0.5–2.0)

## 2015-07-24 LAB — APTT: aPTT: 28 seconds (ref 24–37)

## 2015-07-24 LAB — PROCALCITONIN: PROCALCITONIN: 1.93 ng/mL

## 2015-07-24 LAB — PROTIME-INR
INR: 1.4 (ref 0.00–1.49)
PROTHROMBIN TIME: 17.3 s — AB (ref 11.6–15.2)

## 2015-07-24 LAB — CBG MONITORING, ED: Glucose-Capillary: 103 mg/dL — ABNORMAL HIGH (ref 65–99)

## 2015-07-24 MED ORDER — PIPERACILLIN-TAZOBACTAM IN DEX 2-0.25 GM/50ML IV SOLN
2.2500 g | Freq: Three times a day (TID) | INTRAVENOUS | Status: DC
  Administered 2015-07-24 – 2015-07-25 (×5): 2.25 g via INTRAVENOUS
  Filled 2015-07-24 (×7): qty 50

## 2015-07-24 MED ORDER — VANCOMYCIN HCL IN DEXTROSE 1-5 GM/200ML-% IV SOLN: 1000.0000 mg | Freq: Once | INTRAVENOUS | Status: DC

## 2015-07-24 MED ORDER — METOPROLOL TARTRATE 1 MG/ML IV SOLN: 10.0000 mg | Freq: Once | INTRAVENOUS | Status: DC

## 2015-07-24 MED ORDER — SODIUM CHLORIDE 0.9 % IV SOLN
INTRAVENOUS | Status: DC
  Administered 2015-07-24 (×2): via INTRAVENOUS

## 2015-07-24 MED ORDER — VANCOMYCIN HCL IN DEXTROSE 1-5 GM/200ML-% IV SOLN
1000.0000 mg | INTRAVENOUS | Status: DC
  Administered 2015-07-24: 1000 mg via INTRAVENOUS
  Filled 2015-07-24 (×2): qty 200

## 2015-07-24 MED ORDER — SODIUM CHLORIDE 0.9 % IV BOLUS (SEPSIS)
1000.0000 mL | Freq: Once | INTRAVENOUS | Status: AC
  Administered 2015-07-24: 1000 mL via INTRAVENOUS

## 2015-07-24 MED ORDER — IOHEXOL 300 MG/ML  SOLN: 25.0000 mL | INTRAMUSCULAR | Status: DC

## 2015-07-24 MED ORDER — HEPARIN SODIUM (PORCINE) 5000 UNIT/ML IJ SOLN
5000.0000 [IU] | Freq: Three times a day (TID) | INTRAMUSCULAR | Status: DC
  Administered 2015-07-24 – 2015-07-28 (×13): 5000 [IU] via SUBCUTANEOUS
  Filled 2015-07-24 (×12): qty 1

## 2015-07-24 MED ORDER — SODIUM CHLORIDE 0.9 % IV BOLUS (SEPSIS)
500.0000 mL | Freq: Once | INTRAVENOUS | Status: AC
  Administered 2015-07-24: 500 mL via INTRAVENOUS

## 2015-07-24 MED ORDER — PANTOPRAZOLE SODIUM 40 MG IV SOLR
40.0000 mg | INTRAVENOUS | Status: DC
  Administered 2015-07-24 (×2): 40 mg via INTRAVENOUS
  Filled 2015-07-24 (×2): qty 40

## 2015-07-24 MED ORDER — PIPERACILLIN-TAZOBACTAM 3.375 G IVPB 30 MIN: 3.3750 g | Freq: Once | INTRAVENOUS | Status: DC

## 2015-07-24 NOTE — Progress Notes (Signed)
CRITICAL VALUE ALERT  Critical value received: Lactic Acid 2.6  Date of notification:  07/24/15  Time of notification:  0800  Critical value read back:Yes.    Nurse who received alert:  Ronna Polio  MD notified (1st page):  McClung   Time of first page:  0802  MD notified (2nd page):  Time of second page:  Responding MD:  Sharon Seller  Time MD responded:  [phone]

## 2015-07-24 NOTE — Progress Notes (Signed)
Two attempts were made to place an NG tube per MD. Both times unsuccessful. Notified Odie Sera, MD. MD advised no more attempts. CT notified that pt is ready to go to CT.

## 2015-07-24 NOTE — Progress Notes (Signed)
[place] TEAM 1 - Stepdown/ICU TEAM PROGRESS NOTE  Arturo Sofranko WUJ:[id number] DOB: 1922-03-15 DOA: 07/23/2015 PCP: Georgianne Fick, MD  Admit HPI / Brief Narrative: 82 y.o. male with hx of moderate dementia (able to dress and feed himself at baseline) and hypertension who presented to the ED with his caregivers who reported decreased level of consciousness over 3 days. Patient was essentially bedbound for 3 days with no PO intake. EMS was activated and found him to be lethargic and responding to pain only. His blood pressure was 82/50 in the field and a 250 mL bolus was given. Patient was tachypneic on the scene and EMS was unable to obtain a pulse oximetry reading. He was transported with oxygen per nasal cannula and reportedly made some improvement en route.   In ED, patient was found to be afebrile, requiring 6 L/m supplemental oxygen to maintain saturations in the 90s, and with stable blood pressure. EKG demonstrated atrial fibrillation with a rate of 84. Chest x-ray revealed bibasilar opacities, reflecting atelectasis or pneumonia. Foley catheter was placed and thick brown material returned. Blood work revealed BUN 163 and creatinine of 5.3 with sodium 152 potassium 5.9. There was a leukocytosis to 14,000. Initial lactate was 4.6. Patient was bolused with 2 L of normal saline in the emergency department, blood and urine cultures were obtained, and empiric Rocephin and azithromycin were administered intravenously.   HPI/Subjective: Pt seen for f/u visit.  Assessment/Plan:  Severe sepsis secondary to UTI +/-    CAP v/s aspiration pneumonitis with acute hypoxic respiratory failure  Acute encephalopathy in setting of dementia   AKI with hyperkalemia Cr 5.3 on admission - baseline 1.1   Hypernatremia  secondary to profound dehydration   Hx of Hypertension   Protein-calorie malnutrition   MRSA screen +  Code Status: FULL Family Communication: no family present at time  of exam Disposition Plan: SDU  Consultants: none  Procedures: none  Antibiotics: Azithromycin 2/3 Ceftriaxone 2/3 Zosyn 2/3 > Vancomycin 2/3 >  DVT prophylaxis: SQ heparin   Objective: Blood pressure 73/55, pulse 115, temperature 97.9 F (36.6 C), temperature source Core (Comment), resp. rate 29, height  (1.753 m), weight 55.7 kg (122 lb 12.7 oz), SpO2 94 %.  Intake/Output Summary (Last 24 hours) at 07/24/15 1110 Last data filed at 07/24/15 0800  Gross per 24 hour  Intake 879.17 ml  Output   1650 ml  Net -770.83 ml   Exam: Pt seen for f/u visit.  Data Reviewed: Basic Metabolic Panel:  Recent Labs Lab 07/23/15 2120 07/24/15 0358  NA 152* 154*  K 5.9* 5.3*  CL 114* 118*  CO2 19* 18*  GLUCOSE 139* 160*  BUN 163* 152*  CREATININE 5.32* 4.45*  CALCIUM 9.3 8.7*   CBC:  Recent Labs Lab 07/23/15 2120 07/24/15 0358  WBC 14.0* 11.4*  NEUTROABS  --  10.3*  HGB 13.9 10.8*  HCT 43.3 34.3*  MCV 88.9 88.2  PLT 379 299   Liver Function Tests:  Recent Labs Lab 07/23/15 2120 07/24/15 0358  AST 45* 43*  ALT 42 38  ALKPHOS 85 73  BILITOT 0.9 0.8  PROT 8.1 6.6  ALBUMIN 2.4* 1.8*   Coags:  Recent Labs Lab 07/24/15 0358  INR 1.40    Recent Labs Lab 07/24/15 0358  APTT 28    Cardiac Enzymes: No results for input(s): CKTOTAL, CKMB, CKMBINDEX, TROPONINI in the last 168 hours.  CBG:  Recent Labs Lab 07/23/15 2051  GLUCAP 103*    Recent Results (  from the past 240 hour(s))  MRSA PCR Screening     Status: Abnormal   Collection Time: 07/24/15  2:58 AM  Result Value Ref Range Status   MRSA by PCR POSITIVE (A) NEGATIVE Final    Comment:        The GeneXpert MRSA Assay (FDA approved for NASAL specimens only), is one component of a comprehensive MRSA colonization surveillance program. It is not intended to diagnose MRSA infection nor to guide or monitor treatment for MRSA infections. RESULT CALLED TO, READ BACK BY AND VERIFIED  WITH: M ENNIS,RN  07/24/15 MKELLY   Culture, respiratory (NON-Expectorated)     Status: None (Preliminary result)   Collection Time: 07/24/15  6:25 AM  Result Value Ref Range Status   Specimen Description SPUTUM  Final   Special Requests NTG  Final   Gram Stain PENDING  Incomplete   Culture PENDING  Incomplete   Report Status PENDING  Incomplete     Studies:   Recent x-ray studies have been reviewed in detail by the Attending Physician  Scheduled Meds:  Scheduled Meds: . heparin  5,000 Units Subcutaneous 3 times per day  . metoprolol  10 mg Intravenous Once  . pantoprazole (PROTONIX) IV  40 mg Intravenous Q24H  . piperacillin-tazobactam (ZOSYN)  IV  2.25 g Intravenous 3 times per day  . vancomycin  1,000 mg Intravenous Q48H    Time spent on care of this patient: No charge   Lonia Blood , MD   [facility] Office  [phone] Pager - Text Page per Amion as per below:  On-Call/Text Page:      [email or web address]      password [email or web address]  If 7PM-7AM, please contact night-coverage [email or web address] Password [email or web address] 07/24/2015, 11:10 AM   LOS: 0 days

## 2015-07-24 NOTE — ED Notes (Signed)
Attempted report x1. 

## 2015-07-24 NOTE — Progress Notes (Signed)
Cardiac rhythm change noted by this RN, vitals obtained, BP 85/50. EKG verifies atrial fibrillation with rapid ventricular response. MD notified, will await further orders and continue to monitor patient closely.

## 2015-07-24 NOTE — H&P (Signed)
[facility] History and Physical  Greg Young ZOX:[id number] DOB: 1921-06-30 DOA: 07/23/2015  Referring physician: ED physician PCP: Georgianne Fick, MD  Specialists: none  Chief Complaint:  Decreased level of consciousness   HPI: Greg Young is a 82 y.o. male with PMH of moderate dementia, able to dress and feed himself at baseline, and hypertension who presents to the ED with his caregivers who report decreased level of consciousness progressing over the past 3 days. Patient was reportedly in his usual state, conversant, feeding himself, and dressing himself up until 3 days ago when he was noted to become less active and somnolent. This progressed over the ensuing days and the patient was essentially bedbound the past 3 days with no PO intake. His caregivers became particularly concerned this afternoon when they were unable to obtain a response from the patient. EMS was activated and upon their arrival, patient was found to be lethargic and responding to pain only. His blood pressure was 82/50 in the field and 250 mL bolus was given. Patient was tachypneic on the scene and EMS was unable to obtain a pulse oximetry reading. He was transported with oxygen per nasal cannula and reportedly made some improvement en route.   In ED, patient was found to be afebrile, requiring 6 L/m supplemental oxygen to maintain saturations in the 90s, and with stable blood pressure. EKG was obtained, demonstrating atrial fibrillation with a rate of 84. Chest x-ray reveals bibasilar opacities, possibly reflecting atelectasis or pneumonia. Foley catheter was placed and thick brown material returned. Initial blood work reveals markedly deranged CMP with BUN 163 and serum creatinine of 5.3 with sodium 152 potassium 5.9. There is a leukocytosis to 14,000 on the CBC. Initial lactate is elevated to 4.6. Patient was bolused with 2 L of normal saline in the emergency department, blood and urine cultures were obtained, and  empiric Rocephin and azithromycin were administered intravenously. Patient remained hemodynamically stable in the emergency department and will be admitted to the stepdown unit for ongoing evaluation and management of severe sepsis secondary to UTI; there is suspicion for CAP as well.  Where does patient live?   At home    Can patient participate in ADLs?  Some   Review of Systems:  Unable to obtain ROS secondary to pt's clinical condition with non-verbal state.     Allergy: No Known Allergies  Past Medical History  Diagnosis Date  . Hypertension   . Hard of hearing   . Dementia     "moderate dementia"    History reviewed. No pertinent past surgical history.  Social History:  reports that he has quit smoking. He has quit using smokeless tobacco. His smokeless tobacco use included Chew. He reports that he does not drink alcohol or use illicit drugs.  Family History:  Unable to obtain FHx secondary to pt's clinical condition with non-verbal state. Family History  Problem Relation Age of Onset  . Family history unknown: Yes     Prior to Admission medications   Medication Sig Start Date End Date Taking? Authorizing Provider  calcium carbonate (OSCAL) 1500 (600 Ca) MG TABS tablet Take 1,500 mg by mouth daily with breakfast.   Yes Historical Provider, MD  docusate sodium (COLACE) 100 MG capsule Take 100-200 mg by mouth 3 (three) times daily. Takes 2 caps in am, 1 cap at lunch, 2 caps in evening   Yes Historical Provider, MD  Melatonin 5 MG TABS Take 5 mg by mouth at bedtime.   Yes Historical Provider, MD  azithromycin (ZITHROMAX) 500 MG tablet Take 1 tablet (500 mg total) by mouth daily. 07/01/13   Elease Etienne, MD  cefpodoxime (VANTIN) 200 MG tablet Take 1 tablet (200 mg total) by mouth every 12 (twelve) hours. 07/01/13   Elease Etienne, MD  guaiFENesin (MUCINEX) 600 MG 12 hr tablet Take 1 tablet (600 mg total) by mouth 2 (two) times daily. 07/01/13   Elease Etienne, MD   guaiFENesin-dextromethorphan (ROBITUSSIN DM) 100-10 MG/5ML syrup Take 5 mLs by mouth every 4 (four) hours as needed for cough. 07/01/13   Elease Etienne, MD  PRESCRIPTION MEDICATION Take 1 capsule by mouth 3 (three) times daily. Antibiotic-unsure of kind 07/16/15   Historical Provider, MD    Physical Exam: Filed Vitals:   07/23/15 2330 07/24/15 0000 07/24/15 0030 07/24/15 0100  BP: 111/68 112/49 99/58 103/49  Pulse: 76 74 75 73  Temp: 98.8 F (37.1 C) 99.1 F (37.3 C) 99 F (37.2 C) 99 F (37.2 C)  TempSrc:      Resp: SpO2: 99% 100% 100% 100%   General: Cachectic, pale HEENT:       Eyes: no scleral icterus or conjunctival pallor.       ENT: No discharge from the ears or nose, oral mucosa dry and tacky.        Neck: No JVD, no bruit, no appreciable mass Heme: No cervical adenopathy, scattered ecchymoses Cardiac: Rate ~60 and irregular, No murmurs, No gallops or rubs. Pulm: Respirations shallow and rapid, coarse rocnchi over both bases, nasal canula in place. Abd: Nondistended, but firm with guarding, BS present. Ext: No LE edema bilaterally. 2+DP/PT pulse bilaterally. Musculoskeletal: No gross deformity, no red, hot, swollen joints   Skin: No rashes or wounds on exposed surfaces  Neuro: Obtunded, responding to voice intermittently, non-verbal, not following commands, pupils equal, round, and sluggishly reactive. Corneal reflex intact. Babinski downgoing b/l. 2+ patellar DTR b/l.   Labs on Admission:  Basic Metabolic Panel:  Recent Labs Lab 07/23/15 2120  NA 152*  K 5.9*  CL 114*  CO2 19*  GLUCOSE 139*  BUN 163*  CREATININE 5.32*  CALCIUM 9.3   Liver Function Tests:  Recent Labs Lab 07/23/15 2120  AST 45*  ALT 42  ALKPHOS 85  BILITOT 0.9  PROT 8.1  ALBUMIN 2.4*   No results for input(s): LIPASE, AMYLASE in the last 168 hours. No results for input(s): AMMONIA in the last 168 hours. CBC:  Recent Labs Lab 07/23/15 2120  WBC 14.0*  HGB 13.9   HCT 43.3  MCV 88.9  PLT 379   Cardiac Enzymes: No results for input(s): CKTOTAL, CKMB, CKMBINDEX, TROPONINI in the last 168 hours.  BNP (last 3 results) No results for input(s): BNP in the last 8760 hours.  ProBNP (last 3 results) No results for input(s): PROBNP in the last 8760 hours.  CBG: No results for input(s): GLUCAP in the last 168 hours.  Radiological Exams on Admission: Dg Chest Port 1 View  07/23/2015  CLINICAL DATA:  Sepsis. EXAM: PORTABLE CHEST 1 VIEW COMPARISON:  06/27/2013 FINDINGS: Chronic elevation of right hemidiaphragm. Cardiomediastinal contours are unchanged. Bibasilar opacities are again seen, with interval increase particularly on the left. No pulmonary edema. No large pleural effusion or pneumothorax. IMPRESSION: Bibasilar opacities, with interval increase from prior exam from two years prior. This may reflect atelectasis or pneumonia. Given distribution, aspiration is considered. Electronically Signed   By: Rubye Oaks M.D.   On: 07/23/2015 22:53  EKG: Independently reviewed.  Abnormal findings:  Atrial fibrillation, rate 84   Assessment/Plan  1. Severe sepsis secondary to UTI, CAP with acute hypoxic respiratory failure  - Requiring 6 Lpm to maintain adequate sats in the ED - Urine thick and brown w/ Lg leuks and TNTC wbc, culture pending  - CXR with bibasilar opacities representing PNA vs atelectasis, pattern of involvement raises concern for aspiration  - Lactate 4.64 -> 3.35; wbc 14k - Received full 30 cc/kg NS bolus in ED, continuing with NS at 125 cc/hr  - Received empiric Rocephin and Azithromycin in ED  - Given critical illness, concern for aspiration PNA, will broaden out for now, vanc and Zosyn per pharmacy  - Trend lactate, procalcitonin; follow cultures; check urine antigens for strep pneumo  - Abd firm with guarding on exam, will obtain CT to r/o intraabdominal abscess or focus of in infxn   2. Acute encephalopathy, dementia  - Suspect  this is a toxic-metabolic problem secondary to severe sepsis on background of dementia  - Dresses and feeds self at baseline  - Monitor for improvement with treatment of sepsis    3. AKI with hyperkalemia - SCr 5.3 on admission, up from apparent baseline of 1.1  - Likely ATN secondary to sepsis with poor renal perfusion - Potassium 5.9 on arrival; anticipate improvement with IVF resuscitation, will repeat chem panel in 4 hrs to ensure not rising  - No apparent EKG change   4. Hypernatremia  - Na 152 on presentation   - Secondary to profound dehydration  - Expect resolution with aggressive fluid repletion  - Will continue with NS given sepsis and dehydration, - Will likely switch to 1/2 normal if not improving on repeat chem panel    5. Hypertension  - Pt critically ill with sepsis, raising concern for progression to shock  - BP has been adequate so far, but not hypertensive  - Hold all antihypertensives for now - Cautious resumption of antihypertensives if pt recovers from sepsis    6. Protein-calorie malnutrition  - Pt is cachectic, albumin 2.4  - Nutritionist consultation requested, but pt critically ill at this time and unable to safely trial PO      DVT ppx: SQ Heparin    Code Status: DNR Family Communication: None at bed side.  Attempts to reach patient's son (POA) unsuccessful.  Disposition Plan: Admit to inpatient   Date of Service 07/24/2015    Briscoe Deutscher, MD [facility] Pager [phone]  If 7PM-7AM, please contact night-coverage [email or web address] Password [email or web address] 07/24/2015, 2:09 AM

## 2015-07-24 NOTE — Progress Notes (Signed)
Pharmacy Antibiotic Note  Greg Young is a 82 y.o. male admitted on 07/23/2015 with pneumonia and sepsis.  Pharmacy has been consulted for Vancomycin and Zosyn dosing. WBC mildly elevated, acute renal failure (baseline 1.1-1.4).   Plan: -Vancomycin 1000 mg IV q48h -Zosyn 2.25g IV q8h -Trend WBC, temp, renal function  -Drug levels as indicated   Temp (24hrs), Avg:98.7 F (37.1 C), Min:97.9 F (36.6 C), Max:99.1 F (37.3 C)   Recent Labs Lab 07/23/15 2120 07/23/15 2135 07/23/15 2346  WBC 14.0*  --   --   CREATININE 5.32*  --   --   LATICACIDVEN  --  4.64* 3.35*   No Known Allergies  Thank you for allowing pharmacy to be a part of this patient's care.  Abran Duke 07/24/2015 2:10 AM

## 2015-07-25 LAB — CBC
HEMATOCRIT: 30.9 % — AB (ref 39.0–52.0)
Hemoglobin: 9.8 g/dL — ABNORMAL LOW (ref 13.0–17.0)
MCH: 27.9 pg (ref 26.0–34.0)
MCHC: 31.7 g/dL (ref 30.0–36.0)
MCV: 88 fL (ref 78.0–100.0)
PLATELETS: 275 10*3/uL (ref 150–400)
RBC: 3.51 MIL/uL — AB (ref 4.22–5.81)
RDW: 16.2 % — ABNORMAL HIGH (ref 11.5–15.5)
WBC: 10 10*3/uL (ref 4.0–10.5)

## 2015-07-25 LAB — COMPREHENSIVE METABOLIC PANEL
ALT: 34 U/L (ref 17–63)
AST: 49 U/L — ABNORMAL HIGH (ref 15–41)
Albumin: 1.6 g/dL — ABNORMAL LOW (ref 3.5–5.0)
Alkaline Phosphatase: 73 U/L (ref 38–126)
BUN: 109 mg/dL — ABNORMAL HIGH (ref 6–20)
CO2: 15 mmol/L — AB (ref 22–32)
CREATININE: 2.8 mg/dL — AB (ref 0.61–1.24)
Calcium: 7.5 mg/dL — ABNORMAL LOW (ref 8.9–10.3)
GFR, EST AFRICAN AMERICAN: 21 mL/min — AB (ref >60)
GFR, EST NON AFRICAN AMERICAN: 18 mL/min — AB (ref >60)
Glucose, Bld: 84 mg/dL (ref 65–99)
POTASSIUM: 3.7 mmol/L (ref 3.5–5.1)
SODIUM: 161 mmol/L — AB (ref 135–145)
Total Bilirubin: 0.8 mg/dL (ref 0.3–1.2)
Total Protein: 5.2 g/dL — ABNORMAL LOW (ref 6.5–8.1)

## 2015-07-25 LAB — URINE CULTURE: Culture: NO GROWTH

## 2015-07-25 LAB — HIV ANTIBODY (ROUTINE TESTING W REFLEX): HIV SCREEN 4TH GENERATION: NONREACTIVE

## 2015-07-25 MED ORDER — DIGOXIN 0.25 MG/ML IJ SOLN: 0.1250 mg | Freq: Once | INTRAMUSCULAR | Status: DC

## 2015-07-25 MED ORDER — DEXTROSE 5 % IV SOLN
1.0000 g | INTRAVENOUS | Status: DC
  Administered 2015-07-25 – 2015-07-27 (×3): 1 g via INTRAVENOUS
  Filled 2015-07-25 (×4): qty 10

## 2015-07-25 MED ORDER — SODIUM CHLORIDE 0.45 % IV SOLN
INTRAVENOUS | Status: DC
  Administered 2015-07-25 (×2): via INTRAVENOUS

## 2015-07-25 MED ORDER — DIGOXIN 0.25 MG/ML IJ SOLN
0.2500 mg | Freq: Once | INTRAMUSCULAR | Status: DC
  Filled 2015-07-25: qty 2

## 2015-07-25 MED ORDER — SODIUM CHLORIDE 0.9 % IV BOLUS (SEPSIS)
500.0000 mL | Freq: Once | INTRAVENOUS | Status: AC
  Administered 2015-07-25: 500 mL via INTRAVENOUS

## 2015-07-25 MED ORDER — METOPROLOL TARTRATE 1 MG/ML IV SOLN: 2.5000 mg | Freq: Once | INTRAVENOUS | Status: DC

## 2015-07-25 NOTE — Progress Notes (Signed)
CRITICAL VALUE ALERT  Critical value received: Sodium 161 and Chloride >130  Date of notification:  07/25/15  Time of notification:  0611  Critical value read back: YES  Nurse who received alert: Janice Norrie, RN  MD notified (1st page):  Claiborne Billings, NP  Time of first page:  [phone]  MD notified (2nd page):  Time of second page:  Responding MD:   Time MD responded:

## 2015-07-25 NOTE — Progress Notes (Signed)
Pt arrived from ED via stretcher to 2C14. Pt had to be thoroughly cleansed. Excessive amounts of powder was on his sacrum and MASD was found in his groin. Unstageable pressure ulcer on sacrum, deep tissue injury on right hip and skin tear on left forearm were noted on patient upon arrival.

## 2015-07-25 NOTE — Progress Notes (Signed)
Greg Young TEAM 1 - Stepdown/ICU TEAM PROGRESS NOTE  Si Jachim ZOX:[id number] DOB: 09-30-1921 DOA: 07/23/2015 PCP: Georgianne Fick, MD  Admit HPI / Brief Narrative: 82 y.o. male with hx of moderate dementia (able to dress and feed himself at baseline) and hypertension who presented to the ED with his caregivers who reported decreased level of consciousness over 3 days. Patient was essentially bedbound for 3 days with no PO intake. EMS was activated and found him to be lethargic and responding to pain only. His blood pressure was 82/50 in the field and a 250 mL bolus was given. Patient was tachypneic on the scene and EMS was unable to obtain a pulse oximetry reading. He was transported with oxygen per nasal cannula and reportedly made some improvement en route.   In ED, patient was found to be afebrile, requiring 6 L/m supplemental oxygen to maintain saturations in the 90s, and with stable blood pressure. EKG demonstrated atrial fibrillation with a rate of 84. Chest x-ray revealed bibasilar opacities, reflecting atelectasis or pneumonia. Foley catheter was placed and thick brown material returned. Blood work revealed BUN 163 and creatinine of 5.3 with sodium 152 potassium 5.9. There was a leukocytosis to 14,000. Initial lactate was 4.6. Patient was bolused with 2 L of normal saline in the emergency department, blood and urine cultures were obtained, and empiric Rocephin and azithromycin were administered intravenously.   HPI/Subjective: The patient is unresponsive today.  He does not appear to be uncomfortable.  He is not in acute respiratory distress.  There is no family present at time of visit.  Assessment/Plan:  Severe sepsis secondary to Escherichia coli UTI   Overall blood pressure has been trending upward with mean arterial pressure more consistently at 65 or greater - continue to hydrate as able but slow rate given advanced age - narrow antibiotic to Rocephin alone based upon urine  culture  CAP v/s aspiration pneumonitis with acute hypoxic respiratory failure I suspect x-ray findings represent atelectasis as well as possible aspiration pneumonitis - no significant respiratory decline at present  Acute encephalopathy in setting of dementia  Most consistent with toxic metabolic encephalopathy due to severe sepsis and uremia  AKI with hyperkalemia Cr 5.3 on admission - baseline 1.1 - creatinine has improved significantly already - continue to hydrate  Hypernatremia  Initially felt to be due to profound dehydration - worsening with isotonic saline - changed to hypotonic saline - follow   Hx of Hypertension  Not an active issue at this time  Protein-calorie malnutrition   MRSA screen +  Code Status: DNR - NO CODE BLUE  Family Communication: no family present at time of exam Disposition Plan: SDU  Consultants: none  Procedures: none  Antibiotics: Azithromycin 2/3 Ceftriaxone 2/3 + 2/5 > Zosyn 2/3 > 2/5 Vancomycin 2/3 > 2/5  DVT prophylaxis: SQ heparin   Objective: Blood pressure 101/87, pulse 124, temperature 100 F (37.8 C), temperature source Core (Comment), resp. rate 32, height  (1.753 m), weight 55.7 kg (122 lb 12.7 oz), SpO2 96 %.  Intake/Output Summary (Last 24 hours) at 07/25/15 1538 Last data filed at 07/25/15 1439  Gross per 24 hour  Intake 4039.58 ml  Output   1800 ml  Net 2239.58 ml   Exam: General: No acute respiratory distress evident - unresponsive  Lungs: Mild bibasilar crackles without wheeze Cardiovascular: Irregular - no significant tachycardia presently - no appreciable murmur Abdomen: nondistended, soft, bowel sounds hypoactive, no ascites, no appreciable mass Extremities: No significant cyanosis, clubbing, or  edema bilateral lower extremities  Data Reviewed: Basic Metabolic Panel:  Recent Labs Lab 07/23/15 2120 07/24/15 0358 07/25/15 0358  NA 152* 154* 161*  K 5.9* 5.3* 3.7  CL 114* 118* >130*    CO2 19* 18* 15*  GLUCOSE 139* 160* 84  BUN 163* 152* 109*  CREATININE 5.32* 4.45* 2.80*  CALCIUM 9.3 8.7* 7.5*   CBC:  Recent Labs Lab 07/23/15 2120 07/24/15 0358 07/25/15 0358  WBC 14.0* 11.4* 10.0  NEUTROABS  --  10.3*  --   HGB 13.9 10.8* 9.8*  HCT 43.3 34.3* 30.9*  MCV 88.9 88.2 88.0  PLT 379 299 275   Liver Function Tests:  Recent Labs Lab 07/23/15 2120 07/24/15 0358 07/25/15 0358  AST 45* 43* 49*  ALT 42 38 34  ALKPHOS 85 73 73  BILITOT 0.9 0.8 0.8  PROT 8.1 6.6 5.2*  ALBUMIN 2.4* 1.8* 1.6*   Coags:  Recent Labs Lab 07/24/15 0358  INR 1.40    Recent Labs Lab 07/24/15 0358  APTT 28   CBG:  Recent Labs Lab 07/23/15 2051  GLUCAP 103*    Recent Results (from the past 240 hour(s))  Blood Culture (routine x 2)     Status: None (Preliminary result)   Collection Time: 07/23/15  9:20 PM  Result Value Ref Range Status   Specimen Description BLOOD RIGHT ARM  Final   Special Requests BOTTLES DRAWN AEROBIC AND ANAEROBIC 5CC  Final   Culture NO GROWTH < 24 HOURS  Final   Report Status PENDING  Incomplete  Blood Culture (routine x 2)     Status: None (Preliminary result)   Collection Time: 07/23/15  9:20 PM  Result Value Ref Range Status   Specimen Description BLOOD LEFT ARM  Final   Special Requests BOTTLES DRAWN AEROBIC AND ANAEROBIC 5CC  Final   Culture NO GROWTH < 24 HOURS  Final   Report Status PENDING  Incomplete  Urine culture     Status: None   Collection Time: 07/23/15  9:20 PM  Result Value Ref Range Status   Specimen Description URINE, CATHETERIZED  Final   Special Requests NONE  Final   Culture NO GROWTH 2 DAYS  Final   Report Status 07/25/2015 FINAL  Final  Urine culture     Status: None (Preliminary result)   Collection Time: 07/23/15 10:10 PM  Result Value Ref Range Status   Specimen Description URINE, CATHETERIZED  Final   Special Requests NONE  Final   Culture 30,000 COLONIES/mL ESCHERICHIA COLI  Final   Report Status  PENDING  Incomplete  MRSA PCR Screening     Status: Abnormal   Collection Time: 07/24/15  2:58 AM  Result Value Ref Range Status   MRSA by PCR POSITIVE (A) NEGATIVE Final    Comment:        The GeneXpert MRSA Assay (FDA approved for NASAL specimens only), is one component of a comprehensive MRSA colonization surveillance program. It is not intended to diagnose MRSA infection nor to guide or monitor treatment for MRSA infections. RESULT CALLED TO, READ BACK BY AND VERIFIED WITH: M ENNIS,RN @0651  07/24/15 MKELLY   Culture, respiratory (NON-Expectorated)     Status: None (Preliminary result)   Collection Time: 07/24/15  6:25 AM  Result Value Ref Range Status   Specimen Description SPUTUM  Final   Special Requests NTG  Final   Gram Stain PENDING  Incomplete   Culture PENDING  Incomplete   Report Status PENDING  Incomplete  Studies:   Recent x-ray studies have been reviewed in detail by the Attending Physician  Scheduled Meds:  Scheduled Meds: . heparin  5,000 Units Subcutaneous 3 times per day  . pantoprazole (PROTONIX) IV  40 mg Intravenous Q24H  . piperacillin-tazobactam (ZOSYN)  IV  2.25 g Intravenous 3 times per day  . vancomycin  1,000 mg Intravenous Q48H    Time spent on care of this patient: 35 mins   MCCLUNG,JEFFREY T , MD   [facility] Office  [phone] Pager - Text Page per Loretha Stapler as per below:  On-Call/Text Page:      [email or web address]      password [email or web address]  If 7PM-7AM, please contact night-coverage [email or web address] Password [email or web address] 07/25/2015, 3:38 PM   LOS: 1 day

## 2015-07-26 LAB — LEGIONELLA ANTIGEN, URINE

## 2015-07-26 LAB — CBC
HCT: 35.4 % — ABNORMAL LOW (ref 39.0–52.0)
Hemoglobin: 11.1 g/dL — ABNORMAL LOW (ref 13.0–17.0)
MCH: 28 pg (ref 26.0–34.0)
MCHC: 31.4 g/dL (ref 30.0–36.0)
MCV: 89.2 fL (ref 78.0–100.0)
PLATELETS: 323 10*3/uL (ref 150–400)
RBC: 3.97 MIL/uL — AB (ref 4.22–5.81)
RDW: 16.6 % — AB (ref 11.5–15.5)
WBC: 15.9 10*3/uL — AB (ref 4.0–10.5)

## 2015-07-26 LAB — URINE CULTURE

## 2015-07-26 LAB — COMPREHENSIVE METABOLIC PANEL
ALK PHOS: 82 U/L (ref 38–126)
ALT: 40 U/L (ref 17–63)
AST: 61 U/L — AB (ref 15–41)
Albumin: 1.5 g/dL — ABNORMAL LOW (ref 3.5–5.0)
BILIRUBIN TOTAL: 1 mg/dL (ref 0.3–1.2)
BUN: 90 mg/dL — AB (ref 6–20)
CALCIUM: 7.8 mg/dL — AB (ref 8.9–10.3)
CO2: 14 mmol/L — ABNORMAL LOW (ref 22–32)
CREATININE: 2.53 mg/dL — AB (ref 0.61–1.24)
GFR, EST AFRICAN AMERICAN: 24 mL/min — AB (ref >60)
GFR, EST NON AFRICAN AMERICAN: 21 mL/min — AB (ref >60)
Glucose, Bld: 88 mg/dL (ref 65–99)
Potassium: 3.5 mmol/L (ref 3.5–5.1)
Sodium: 164 mmol/L (ref 135–145)
TOTAL PROTEIN: 5.4 g/dL — AB (ref 6.5–8.1)

## 2015-07-26 LAB — GLUCOSE, CAPILLARY: GLUCOSE-CAPILLARY: 76 mg/dL (ref 65–99)

## 2015-07-26 MED ORDER — SODIUM CHLORIDE 0.9 % IV BOLUS (SEPSIS)
500.0000 mL | Freq: Once | INTRAVENOUS | Status: AC
  Administered 2015-07-26: 500 mL via INTRAVENOUS

## 2015-07-26 MED ORDER — DEXTROSE-NACL 5-0.2 % IV SOLN
INTRAVENOUS | Status: DC
  Administered 2015-07-26: 16:00:00 via INTRAVENOUS

## 2015-07-26 MED ORDER — CHLORHEXIDINE GLUCONATE CLOTH 2 % EX PADS
6.0000 | MEDICATED_PAD | Freq: Every day | CUTANEOUS | Status: DC
  Administered 2015-07-27 – 2015-07-28 (×2): 6 via TOPICAL

## 2015-07-26 MED ORDER — MUPIROCIN 2 % EX OINT
1.0000 "application " | TOPICAL_OINTMENT | Freq: Two times a day (BID) | CUTANEOUS | Status: DC
  Administered 2015-07-26 – 2015-07-28 (×4): 1 via NASAL
  Filled 2015-07-26: qty 22

## 2015-07-26 NOTE — Progress Notes (Signed)
[facility] TEAM 1 - Stepdown/ICU TEAM PROGRESS NOTE  Greg Young ZOX:[id number] DOB: [birth year] DOA: 07/23/2015 PCP: Georgianne Fick, MD  Admit HPI / Brief Narrative: 82 y.o. male with hx of moderate dementia (able to dress and feed himself at baseline) and hypertension who presented to the ED with his caregivers who reported decreased level of consciousness over 3 days. Patient was essentially bedbound for 3 days with no PO intake. EMS was activated and found him to be lethargic and responding to pain only. His blood pressure was 82/50 in the field and a 250 mL bolus was given. Patient was tachypneic on the scene and EMS was unable to obtain a pulse oximetry reading. He was transported with oxygen per nasal cannula and reportedly made some improvement en route.   In ED, patient was found to be afebrile, requiring 6 L/m supplemental oxygen to maintain saturations in the 90s, and with stable blood pressure. EKG demonstrated atrial fibrillation with a rate of 84. Chest x-ray revealed bibasilar opacities, reflecting atelectasis or pneumonia. Foley catheter was placed and thick brown material returned. Blood work revealed BUN 163 and creatinine of 5.3 with sodium 152 potassium 5.9. There was a leukocytosis to 14,000. Initial lactate was 4.6. Patient was bolused with 2 L of normal saline in the emergency department, blood and urine cultures were obtained, and empiric Rocephin and azithromycin were administered intravenously.   HPI/Subjective: The patient will open his eyes today but cannot respond.  He does not follow the examiner.  There is no family present at time of visit.  He does not however appear uncomfortable.  Assessment/Plan:  Severe sepsis secondary to pan-sensitive Escherichia coli UTI   Blood pressure has now normalized - physiologically the patient appears to be slowly improving - mentally however he has not yet made significant improvement  CAP v/s aspiration pneumonitis with acute  hypoxic respiratory failure I suspect x-ray findings represent atelectasis as well as possible aspiration pneumonitis - no significant respiratory decline at present - oxygen saturations remained approximately 95% on 2 L nasal cannula only  Acute encephalopathy in setting of dementia  Most consistent with toxic metabolic encephalopathy due to severe sepsis and uremia - no evidence of significant improvement thus far  AKI with hyperkalemia Cr 5.3 on admission - baseline 1.1 - creatinine continues to slowly improve with ongoing volume resuscitation - elevated potassium resolved  Hypernatremia  Initially felt to be due to profound dehydration - worsening with isotonic saline - changed to hypotonic saline - consistent with significant free water deficit - change IV fluid again and follow  Hx of Hypertension  Not an active issue at this time  AAA 4.4cm at last measurement in Sept 2014 - presently 4.6cm  Protein-calorie malnutrition   MRSA screen +  Code Status: DNR - NO CODE BLUE  Family Communication: no family present at time of exam Disposition Plan: SDU - his present chance of making a meaningful recovery appears quite slim - consult palliative care to initiate discussions with family regarding goals of care  Consultants: none  Procedures: none  Antibiotics: Azithromycin 2/3 Ceftriaxone 2/3 + 2/5 > Zosyn 2/3 > 2/5 Vancomycin 2/3 > 2/5  DVT prophylaxis: SQ heparin   Objective: Blood pressure 116/51, pulse 133, temperature 99.1 F (37.3 C), temperature source Core (Comment), resp. rate 29, height 5\' 9"  (1.753 m), weight 55.7 kg (122 lb 12.7 oz), SpO2 95 %.  Intake/Output Summary (Last 24 hours) at 07/26/15 1509 Last data filed at 07/26/15 1200  Gross per 24  hour  Intake 2541.25 ml  Output   1125 ml  Net 1416.25 ml   Exam: General: No acute respiratory distress evident - noncommunicative  Lungs: Mild bibasilar crackles - no wheeze  Cardiovascular: RRR - no  murmer or rub appreciable  Abdomen: nondistended, soft, bowel sounds hypoactive, no ascites, no appreciable mass Extremities: No significant cyanosis, clubbing, edema bilateral lower extremities  Data Reviewed: Basic Metabolic Panel:  Recent Labs Lab 07/23/15 2120 07/24/15 0358 07/25/15 0358 07/26/15 0538  NA 152* 154* 161* 164*  K 5.9* 5.3* 3.7 3.5  CL 114* 118* >130* >130*  CO2 19* 18* 15* 14*  GLUCOSE 139* 160* 84 88  BUN 163* 152* 109* 90*  CREATININE 5.32* 4.45* 2.80* 2.53*  CALCIUM 9.3 8.7* 7.5* 7.8*   CBC:  Recent Labs Lab 07/23/15 2120 07/24/15 0358 07/25/15 0358 07/26/15 0538  WBC 14.0* 11.4* 10.0 15.9*  NEUTROABS  --  10.3*  --   --   HGB 13.9 10.8* 9.8* 11.1*  HCT 43.3 34.3* 30.9* 35.4*  MCV 88.9 88.2 88.0 89.2  PLT 379 299 275 323   Liver Function Tests:  Recent Labs Lab 07/23/15 2120 07/24/15 0358 07/25/15 0358 07/26/15 0538  AST 45* 43* 49* 61*  ALT 42 38 34 40  ALKPHOS 85 73 73 82  BILITOT 0.9 0.8 0.8 1.0  PROT 8.1 6.6 5.2* 5.4*  ALBUMIN 2.4* 1.8* 1.6* 1.5*   Coags:  Recent Labs Lab 07/24/15 0358  INR 1.40    Recent Labs Lab 07/24/15 0358  APTT 28   CBG:  Recent Labs Lab 07/23/15 2051 07/26/15 0405  GLUCAP 103* 76    Recent Results (from the past 240 hour(s))  Blood Culture (routine x 2)     Status: None (Preliminary result)   Collection Time: 07/23/15  9:20 PM  Result Value Ref Range Status   Specimen Description BLOOD RIGHT ARM  Final   Special Requests BOTTLES DRAWN AEROBIC AND ANAEROBIC 5CC  Final   Culture NO GROWTH 3 DAYS  Final   Report Status PENDING  Incomplete  Blood Culture (routine x 2)     Status: None (Preliminary result)   Collection Time: 07/23/15  9:20 PM  Result Value Ref Range Status   Specimen Description BLOOD LEFT ARM  Final   Special Requests BOTTLES DRAWN AEROBIC AND ANAEROBIC 5CC  Final   Culture NO GROWTH 3 DAYS  Final   Report Status PENDING  Incomplete  Urine culture     Status: None    Collection Time: 07/23/15  9:20 PM  Result Value Ref Range Status   Specimen Description URINE, CATHETERIZED  Final   Special Requests NONE  Final   Culture NO GROWTH 2 DAYS  Final   Report Status 07/25/2015 FINAL  Final  Urine culture     Status: None   Collection Time: 07/23/15 10:10 PM  Result Value Ref Range Status   Specimen Description URINE, CATHETERIZED  Final   Special Requests NONE  Final   Culture 30,000 COLONIES/mL ESCHERICHIA COLI  Final   Report Status 07/26/2015 FINAL  Final   Organism ID, Bacteria ESCHERICHIA COLI  Final      Susceptibility   Escherichia coli - MIC*    AMPICILLIN <=2 SENSITIVE Sensitive     CEFAZOLIN <=4 SENSITIVE Sensitive     CEFTRIAXONE <=1 SENSITIVE Sensitive     CIPROFLOXACIN <=0.25 SENSITIVE Sensitive     GENTAMICIN <=1 SENSITIVE Sensitive     IMIPENEM <=0.25 SENSITIVE Sensitive  NITROFURANTOIN <=16 SENSITIVE Sensitive     TRIMETH/SULFA <=20 SENSITIVE Sensitive     AMPICILLIN/SULBACTAM <=2 SENSITIVE Sensitive     PIP/TAZO <=4 SENSITIVE Sensitive     * 30,000 COLONIES/mL ESCHERICHIA COLI  MRSA PCR Screening     Status: Abnormal   Collection Time: 07/24/15  2:58 AM  Result Value Ref Range Status   MRSA by PCR POSITIVE (A) NEGATIVE Final    Comment:        The GeneXpert MRSA Assay (FDA approved for NASAL specimens only), is one component of a comprehensive MRSA colonization surveillance program. It is not intended to diagnose MRSA infection nor to guide or monitor treatment for MRSA infections. RESULT CALLED TO, READ BACK BY AND VERIFIED WITH: M ENNIS,RN  07/24/15 MKELLY   Culture, respiratory (NON-Expectorated)     Status: None (Preliminary result)   Collection Time: 07/24/15  6:25 AM  Result Value Ref Range Status   Specimen Description SPUTUM  Final   Special Requests NTG  Final   Gram Stain   Final    MODERATE WBC PRESENT, PREDOMINANTLY PMN RARE SQUAMOUS EPITHELIAL CELLS PRESENT MODERATE YEAST FEW GRAM NEGATIVE  RODS RARE GRAM POSITIVE RODS Performed at [facility]    Culture   Final    MODERATE YEAST CONSISTENT WITH CANDIDA SPECIES Performed at [facility]    Report Status PENDING  Incomplete     Studies:   Recent x-ray studies have been reviewed in detail by the Attending Physician  Scheduled Meds:  Scheduled Meds: . cefTRIAXone (ROCEPHIN)  IV  1 g Intravenous Q24H  . heparin  5,000 Units Subcutaneous 3 times per day  . metoprolol  2.5 mg Intravenous Once    Time spent on care of this patient: 35 mins   Hairo Garraway T , MD   [facility] Office  [phone] Pager - Text Page per Loretha Stapler as per below:  On-Call/Text Page:      [email or web address]      password [email or web address]  If 7PM-7AM, please contact night-coverage [email or web address] Password [email or web address] 07/26/2015, 3:09 PM   LOS: 2 days

## 2015-07-26 NOTE — Consult Note (Signed)
WOC wound consult note Reason for Consult: Deep Tissue Injury to sacrum, present on admission.  Stage 1 pressure injury to right hip.  Blanchable redness, intact Incontinence associated dermatitis to perineum, foley in place.  Resolving.  Wound type:Pressure injury IAD to perineum Pressure Ulcer POA: Yes Measurement:Sacrum 3 cm x 4.2 cm maroon discoloration.  Intact.  In gluteal fold, area is somewhat macerated and white in color but is intact at this time.  Right hip 2 cm x 1.5 cm intact persistent nonblanchable redness.  Bony prominence very evident.   Perineum is erythematous, resolving now that skin is clean and dry, and foley is in place.  Wound bed:Intact Drainage (amount, consistency, odor) None Periwound:Erythema Dressing procedure/placement/frequency:Cleanse sacrum with soap and water and pat gently dry.  Apply silicone border sacral foam.  Change every 3 days and PRN soilage.  Turn and reposition every 2 hours to offload pressure.  Keep perineum clean and dry.  Barrier ointment each shift and PRN soilage.  Will not follow at this time.  Please re-consult if needed.  Maple Hudson RN BSN CWON Pager [phone]

## 2015-07-26 NOTE — Progress Notes (Signed)
CRITICAL VALUE ALERT  Critical value received:  Na 164 Cl >130  Date of notification:  07/26/2015  Time of notification:  0645  Critical value read back:Yes  Nurse who received alert:  Terrance Mass RN   MD notified (1st page):  Mcclung  Time of first page:  0654  Similar results to yesterday

## 2015-07-26 NOTE — Progress Notes (Signed)
Received call from male that identified himself as Rody Keadle, the patients son. He stated that he is currently located in the [place] outside of [place], [place]. He gave a contact # of [phone]. He stated that his wishes and his fathers wishes are to "let nature take its course and stay comfortable". I advised this person that a member of the palliative care team would contact him as soon as they could.

## 2015-07-27 ENCOUNTER — Inpatient Hospital Stay (HOSPITAL_COMMUNITY): Payer: Medicare HMO

## 2015-07-27 DIAGNOSIS — E870 Hyperosmolality and hypernatremia: Secondary | ICD-10-CM

## 2015-07-27 DIAGNOSIS — E860 Dehydration: Secondary | ICD-10-CM

## 2015-07-27 DIAGNOSIS — N390 Urinary tract infection, site not specified: Secondary | ICD-10-CM

## 2015-07-27 DIAGNOSIS — N179 Acute kidney failure, unspecified: Secondary | ICD-10-CM

## 2015-07-27 DIAGNOSIS — R6520 Severe sepsis without septic shock: Secondary | ICD-10-CM

## 2015-07-27 DIAGNOSIS — E875 Hyperkalemia: Secondary | ICD-10-CM

## 2015-07-27 DIAGNOSIS — A419 Sepsis, unspecified organism: Secondary | ICD-10-CM

## 2015-07-27 DIAGNOSIS — J189 Pneumonia, unspecified organism: Secondary | ICD-10-CM

## 2015-07-27 DIAGNOSIS — B9620 Unspecified Escherichia coli [E. coli] as the cause of diseases classified elsewhere: Secondary | ICD-10-CM | POA: Diagnosis present

## 2015-07-27 DIAGNOSIS — G9340 Encephalopathy, unspecified: Secondary | ICD-10-CM

## 2015-07-27 DIAGNOSIS — I10 Essential (primary) hypertension: Secondary | ICD-10-CM

## 2015-07-27 LAB — BASIC METABOLIC PANEL
BUN: 69 mg/dL — AB (ref 6–20)
BUN: 68 mg/dL — AB (ref 6–20)
BUN: 62 mg/dL — AB (ref 6–20)
CALCIUM: 8.3 mg/dL — AB (ref 8.9–10.3)
CALCIUM: 8.2 mg/dL — AB (ref 8.9–10.3)
CALCIUM: 8.1 mg/dL — AB (ref 8.9–10.3)
CO2: 14 mmol/L — ABNORMAL LOW (ref 22–32)
CO2: 13 mmol/L — ABNORMAL LOW (ref 22–32)
CO2: 16 mmol/L — ABNORMAL LOW (ref 22–32)
CREATININE: 1.96 mg/dL — AB (ref 0.61–1.24)
CREATININE: 1.89 mg/dL — AB (ref 0.61–1.24)
CREATININE: 1.86 mg/dL — AB (ref 0.61–1.24)
Chloride: >130 mmol/L (ref 101–111)
Chloride: >130 mmol/L (ref 101–111)
GFR calc Af Amer: 33 mL/min — ABNORMAL LOW (ref >60)
GFR calc Af Amer: 34 mL/min — ABNORMAL LOW (ref >60)
GFR calc Af Amer: 35 mL/min — ABNORMAL LOW (ref >60)
GFR, EST NON AFRICAN AMERICAN: 28 mL/min — AB (ref >60)
GFR, EST NON AFRICAN AMERICAN: 29 mL/min — AB (ref >60)
GFR, EST NON AFRICAN AMERICAN: 30 mL/min — AB (ref >60)
GLUCOSE: 125 mg/dL — AB (ref 65–99)
GLUCOSE: 123 mg/dL — AB (ref 65–99)
Glucose, Bld: 157 mg/dL — ABNORMAL HIGH (ref 65–99)
Potassium: 3.5 mmol/L (ref 3.5–5.1)
Potassium: 3.7 mmol/L (ref 3.5–5.1)
Potassium: 3.2 mmol/L — ABNORMAL LOW (ref 3.5–5.1)
SODIUM: 168 mmol/L — AB (ref 135–145)
SODIUM: 167 mmol/L — AB (ref 135–145)
SODIUM: 164 mmol/L — AB (ref 135–145)

## 2015-07-27 LAB — CULTURE, RESPIRATORY

## 2015-07-27 LAB — CBC
HCT: 39.8 % (ref 39.0–52.0)
Hemoglobin: 12 g/dL — ABNORMAL LOW (ref 13.0–17.0)
MCH: 26.7 pg (ref 26.0–34.0)
MCHC: 30.2 g/dL (ref 30.0–36.0)
MCV: 88.6 fL (ref 78.0–100.0)
PLATELETS: 385 10*3/uL (ref 150–400)
RBC: 4.49 MIL/uL (ref 4.22–5.81)
RDW: 16.8 % — AB (ref 11.5–15.5)
WBC: 19.3 10*3/uL — AB (ref 4.0–10.5)

## 2015-07-27 LAB — COMPREHENSIVE METABOLIC PANEL
ALBUMIN: 1.6 g/dL — AB (ref 3.5–5.0)
ALT: 41 U/L (ref 17–63)
AST: 63 U/L — AB (ref 15–41)
Alkaline Phosphatase: 94 U/L (ref 38–126)
BILIRUBIN TOTAL: 0.7 mg/dL (ref 0.3–1.2)
BUN: 72 mg/dL — AB (ref 6–20)
CALCIUM: 8.3 mg/dL — AB (ref 8.9–10.3)
CO2: 16 mmol/L — AB (ref 22–32)
CREATININE: 2.07 mg/dL — AB (ref 0.61–1.24)
GFR calc Af Amer: 31 mL/min — ABNORMAL LOW (ref >60)
GFR, EST NON AFRICAN AMERICAN: 26 mL/min — AB (ref >60)
Glucose, Bld: 127 mg/dL — ABNORMAL HIGH (ref 65–99)
Potassium: 3 mmol/L — ABNORMAL LOW (ref 3.5–5.1)
Sodium: 168 mmol/L (ref 135–145)
Total Protein: 5.9 g/dL — ABNORMAL LOW (ref 6.5–8.1)

## 2015-07-27 LAB — GLUCOSE, CAPILLARY: GLUCOSE-CAPILLARY: 118 mg/dL — AB (ref 65–99)

## 2015-07-27 LAB — CULTURE, RESPIRATORY W GRAM STAIN

## 2015-07-27 MED ORDER — JEVITY 1.2 CAL PO LIQD
1000.0000 mL | ORAL | Status: DC
  Administered 2015-07-27: 20 mL/h
  Filled 2015-07-27 (×3): qty 1000

## 2015-07-27 MED ORDER — DEXTROSE 5 % IV SOLN
INTRAVENOUS | Status: DC
  Administered 2015-07-27 – 2015-07-28 (×2): via INTRAVENOUS

## 2015-07-27 MED ORDER — FLUCONAZOLE IN SODIUM CHLORIDE 200-0.9 MG/100ML-% IV SOLN
200.0000 mg | INTRAVENOUS | Status: DC
  Administered 2015-07-27: 200 mg via INTRAVENOUS
  Filled 2015-07-27 (×2): qty 100

## 2015-07-27 MED ORDER — POTASSIUM CHLORIDE 10 MEQ/100ML IV SOLN
10.0000 meq | INTRAVENOUS | Status: AC
  Administered 2015-07-27 (×3): 10 meq via INTRAVENOUS
  Filled 2015-07-27 (×4): qty 100

## 2015-07-27 MED ORDER — FREE WATER
200.0000 mL | Freq: Every day | Status: DC
  Administered 2015-07-27 – 2015-07-28 (×3): 200 mL

## 2015-07-27 NOTE — Procedures (Signed)
CORTRAK FEEDING TUBE PLACEMENT  Person Inserting Tube:  Citlali Gautney W Tube Type:  Cortrak - 43 inches Tube Location:  Right nare Initial Placement:  Postpyloric Cortrak Total Procedure Time:  13 Cortrak Secured At:  80 cm   Joneen Roach, AGACNP-BC [facility] Pulmonology/Critical Care Pager [phone] or [phone]  07/27/2015 3:23 PM

## 2015-07-27 NOTE — Progress Notes (Addendum)
Initial Nutrition Assessment  DOCUMENTATION CODES:   Severe malnutrition in context of chronic illness, Underweight  INTERVENTION:    Once CORTRAK tube placed, initiate Jevity 1.2 formula at 20 ml/hr and increase by 10 ml every 8 hours to goal rate of 50 ml/hr   Recommend monitoring Mg, Phos, K+ levels  TF regimen to provide 1440 kcals, 67 gm protein, 968 ml of water  NUTRITION DIAGNOSIS:   Inadequate oral intake related to inability to eat as evidenced by NPO status  GOAL:   Patient will meet greater than or equal to 90% of their needs  MONITOR:   TF tolerance, Diet advancement, PO intake, Labs, Weight trends, Skin, I & O's  REASON FOR ASSESSMENT:   Consult Enteral/tube feeding initiation and management  ASSESSMENT:   82 y.o. Male with hx of moderate dementia (able to dress and feed himself at baseline) and hypertension who presented to the ED with his caregivers who reported decreased level of consciousness over 3 days. Patient was essentially bedbound for 3 days with no PO intake. EMS was activated and found him to be lethargic and responding to pain only. His blood pressure was 82/50 in the field and a 250 mL bolus was given. Patient was tachypneic on the scene and EMS was unable to obtain a pulse oximetry reading. He was transported with oxygen per nasal cannula and reportedly made some improvement en route.   RD unable to obtain nutrition hx.  + visible severe muscle and fat loss to upper body.  Per H&P pt was bedbound with no PO intake for 3 days.  Plan is place small bore feeding tube for nutrition support.  He is at refeeding risk given malnutrition.    CWOCN note reviewed.  Pt with DTI to sacrum and Stage I pressure injury to R hip.  Limited Nutrition Focused Physical Exam completed.  Findings are severe fat depletion, severe muscle depletion, and no edema.  Diet Order:  Diet NPO time specified  Skin:  Wound (see comment) (DTI to sacrum, Stage I to R  hip)  Last BM:  2/6  Height:   Ht Readings from Last 1 Encounters:  07/24/15  (1.753 m)    Weight:   Wt Readings from Last 1 Encounters:  07/24/15 122 lb 12.7 oz (55.7 kg)    Ideal Body Weight:  73 kg  BMI:  Body mass index is 18.13 kg/(m^2).  Estimated Nutritional Needs:   Kcal:  1300-1500  Protein:  65-75 gm  Fluid:  >/= 1.5 L  EDUCATION NEEDS:   No education needs identified at this time  Maureen Chatters, RD, LDN Pager #: [phone] After-Hours Pager #: [phone]

## 2015-07-27 NOTE — Progress Notes (Signed)
CRITICAL VALUE ALERT  Critical value received:  Na 168 & Cl > 130  Date of notification:  07/27/15  Time of notification:  0600  Critical value read back: Yes  Nurse who received alert:  Rosamaria Lints, RN  MD notified (1st page):  Craige Cotta  Time of first page:  0605  Critical labs similar to yesterday, orders already in place.

## 2015-07-27 NOTE — Plan of Care (Signed)
Problem: Skin Integrity: Goal: Risk for impaired skin integrity will decrease Outcome: Progressing Pt has a deep tissue injury to the sacrum and stage 1 pressure ulcer to right hip.  Pt is being turned q2 hours and barrier cream with foam is applied to pressure points.

## 2015-07-27 NOTE — Progress Notes (Signed)
Palliative Care RN Note. Started work up for family meeting. POA is in [place], [place]. Called to set up phone meeting for tomorrow. He states that we are welcome to call, but he wants pt to have hospice at home. Pt has 24 hour/day paid CG in the home. Pt's wife died on hospice last year, and they would like to use same company and have hospice set up. Called [facility]; they already rec'd referral last month but have not admitted yet. Provided contact information to their intake dept; they will initiate admission. Spoke with [employer] scientist about plan; also page Dr Joseph Art. Will have Palliative Care NP call son Greg Young tomorrow to discuss prognosis.  Greg Young's phone number in [place] is [phone]. Email is [email or web address]

## 2015-07-27 NOTE — Plan of Care (Signed)
Problem: Pain Managment: Goal: General experience of comfort will improve Outcome: Progressing Pt has had zero pain based on tthe pain assessments of [facility].

## 2015-07-27 NOTE — Progress Notes (Signed)
[place] TEAM 1 - Stepdown/ICU TEAM Progress Note  Greg Young ZOX:[id number] DOB: February 17, 1922 DOA: 07/23/2015 PCP: Georgianne Fick, MD  Admit HPI / Brief Narrative: 82 y.o. WM PMHx Moderate dementia,HTN,  Moderate dementia (able to dress and feed himself at baseline) and hypertension who presented to the ED with his caregivers who reported decreased level of consciousness over 3 days. Patient was essentially bedbound for 3 days with no PO intake. EMS was activated and found him to be lethargic and responding to pain only. His blood pressure was 82/50 in the field and a 250 mL bolus was given. Patient was tachypneic on the scene and EMS was unable to obtain a pulse oximetry reading. He was transported with oxygen per nasal cannula and reportedly made some improvement en route.   In ED, patient was found to be afebrile, requiring 6 L/m supplemental oxygen to maintain saturations in the 90s, and with stable blood pressure. EKG demonstrated atrial fibrillation with a rate of 84. Chest x-ray revealed bibasilar opacities, reflecting atelectasis or pneumonia. Foley catheter was placed and thick brown material returned. Blood work revealed BUN 163 and creatinine of 5.3 with sodium 152 potassium 5.9. There was a leukocytosis to 14,000. Initial lactate was 4.6. Patient was bolused with 2 L of normal saline in the emergency department, blood and urine cultures were obtained, and empiric Rocephin and azithromycin were administered intravenously.   HPI/Subjective: 2/7 A/O 0, withdraws to pain  Assessment/Plan: Severe sepsis secondary to pan-sensitive Escherichia coli UTI  -Blood pressure has now normalized - physiologically the patient appears to be slowly improving - mentally however he has not yet made significant improvement -Palliative care phone conference setup tomorrow between palliative care and son in [place]  CAP v/s aspiration pneumonitis vs fungal pneumonia with acute hypoxic respiratory  failure -Suspect x-ray findings represent atelectasis as well as possible aspiration pneumonitis - no significant respiratory decline at present - oxygen saturations remained approximately 95% on 2 L nasal cannula only -Candida pneumonia is usually not treated but considering patient's worsening status will start fluconazole 200 mg IV daily   Acute encephalopathy in setting of dementia  -Most consistent with toxic metabolic encephalopathy due to severe sepsis, uremia, and hypernatremia  - no evidence of significant improvement thus far  AKI with hyperkalemia Cr 5.3 on admission - baseline 1.1 - creatinine continues to slowly improve with ongoing volume resuscitation - elevated potassium resolved  Hypernatremia  -Initially felt to be due to profound dehydration; worsening with isotonic saline  -D5W 27ml/hr; goal would be to correct to no more than 155 in the next 24 hours -CorTrak placement NG tube; free water 200 ml  6x/day  -BMP q 3 hrs  Hx of Hypertension  -Not an active issue at this time  AAA -4.4cm at last measurement in Sept 2014 - presently 4.6cm  Protein-calorie malnutrition   MRSA screen +   Code Status: DO NOT RESUSCITATE Family Communication: Caregiver present at time of exam Disposition Plan: Hospice?    Consultants: RN Don[facility]  Procedure/Significant Events:    Culture 2/3 blood right/left arm NGTD 2/3 urine positive Escherichia coli pansensitive 2/4 MRSA by PCR positive 2/4 respiratory positive moderate yeast   Antibiotics: Azithromycin 2/3 Ceftriaxone 2/3 + 2/5 > Zosyn 2/3 > 2/5 Vancomycin 2/3 > 2/5   DVT prophylaxis: Subcutaneous heparin   Devices    LINES / TUBES:      Continuous Infusions: . dextrose 75 mL/hr at 07/27/15 1204  . feeding supplement (JEVITY  1.2 CAL) 20 mL/hr (07/27/15 1714)    Objective: VITAL SIGNS: Temp: 99.5 F (37.5 C) (02/07 2015) Temp Source: Core (Comment) (02/07 2015) BP:  112/50 mmHg (02/07 2015) Pulse Rate: 80 (02/07 2015) SPO2; FIO2:   Intake/Output Summary (Last 24 hours) at 07/27/15 2122 Last data filed at 07/27/15 1800  Gross per 24 hour  Intake    350 ml  Output   1420 ml  Net  -1070 ml     Exam: General:  A/O 0, withdraws to pain,No acute respiratory distress Eyes: Negative headache, negative scleral hemorrhage ENT: Negative Runny nose, negative gingival bleeding, black membrane on the roof of mouth and tonsils (dried blood?) Neck:  Negative scars, masses, torticollis, lymphadenopathy, JVD Lungs: Clear to auscultation bilaterally without wheezes or crackles Cardiovascular: Regular rate and rhythm without murmur gallop or rub normal S1 and S2 Abdomen:negative abdominal pain, nondistended, positive soft, bowel sounds, no rebound, no ascites, no appreciable mass Extremities: No significant cyanosis, clubbing, or edema bilateral lower extremities Psychiatric:  Unable to evaluate secondary to altered mental status   Neurologic: Unable to evaluate secondary to altered mental status .   Data Reviewed: Basic Metabolic Panel:  Recent Labs Lab 07/26/15 0538 07/27/15 0440 07/27/15 1307 07/27/15 1710 07/27/15 2026  NA 164* 168* 168* 167* 164*  K 3.5 3.0* 3.5 3.7 3.2*  CL >130* >130* >130* >130* >130*  CO2 14* 16* 14* 13* 16*  GLUCOSE 88 127* 125* 123* 157*  BUN 90* 72* 69* 68* 62*  CREATININE 2.53* 2.07* 1.96* 1.89* 1.86*  CALCIUM 7.8* 8.3* 8.3* 8.2* 8.1*   Liver Function Tests:  Recent Labs Lab 07/23/15 2120 07/24/15 0358 07/25/15 0358 07/26/15 0538 07/27/15 0440  AST 45* 43* 49* 61* 63*  ALT 42 38 34 40 41  ALKPHOS 85 73 73 82 94  BILITOT 0.9 0.8 0.8 1.0 0.7  PROT 8.1 6.6 5.2* 5.4* 5.9*  ALBUMIN 2.4* 1.8* 1.6* 1.5* 1.6*   No results for input(s): LIPASE, AMYLASE in the last 168 hours. No results for input(s): AMMONIA in the last 168 hours. CBC:  Recent Labs Lab 07/23/15 2120 07/24/15 0358 07/25/15 0358  07/26/15 0538 07/27/15 0440  WBC 14.0* 11.4* 10.0 15.9* 19.3*  NEUTROABS  --  10.3*  --   --   --   HGB 13.9 10.8* 9.8* 11.1* 12.0*  HCT 43.3 34.3* 30.9* 35.4* 39.8  MCV 88.9 88.2 88.0 89.2 88.6  PLT 379 299 275 323 385   Cardiac Enzymes: No results for input(s): CKTOTAL, CKMB, CKMBINDEX, TROPONINI in the last 168 hours. BNP (last 3 results)  Recent Labs  07/24/15 0358  BNP 136.5*    ProBNP (last 3 results) No results for input(s): PROBNP in the last 8760 hours.  CBG:  Recent Labs Lab 07/23/15 2051 07/26/15 0405  GLUCAP 103* 76    Recent Results (from the past 240 hour(s))  Blood Culture (routine x 2)     Status: None (Preliminary result)   Collection Time: 07/23/15  9:20 PM  Result Value Ref Range Status   Specimen Description BLOOD RIGHT ARM  Final   Special Requests BOTTLES DRAWN AEROBIC AND ANAEROBIC 5CC  Final   Culture NO GROWTH 4 DAYS  Final   Report Status PENDING  Incomplete  Blood Culture (routine x 2)     Status: None (Preliminary result)   Collection Time: 07/23/15  9:20 PM  Result Value Ref Range Status   Specimen Description BLOOD LEFT ARM  Final   Special Requests BOTTLES DRAWN  AEROBIC AND ANAEROBIC 5CC  Final   Culture NO GROWTH 4 DAYS  Final   Report Status PENDING  Incomplete  Urine culture     Status: None   Collection Time: 07/23/15  9:20 PM  Result Value Ref Range Status   Specimen Description URINE, CATHETERIZED  Final   Special Requests NONE  Final   Culture NO GROWTH 2 DAYS  Final   Report Status 07/25/2015 FINAL  Final  Urine culture     Status: None   Collection Time: 07/23/15 10:10 PM  Result Value Ref Range Status   Specimen Description URINE, CATHETERIZED  Final   Special Requests NONE  Final   Culture 30,000 COLONIES/mL ESCHERICHIA COLI  Final   Report Status 07/26/2015 FINAL  Final   Organism ID, Bacteria ESCHERICHIA COLI  Final      Susceptibility   Escherichia coli - MIC*    AMPICILLIN <=2 SENSITIVE Sensitive      CEFAZOLIN <=4 SENSITIVE Sensitive     CEFTRIAXONE <=1 SENSITIVE Sensitive     CIPROFLOXACIN <=0.25 SENSITIVE Sensitive     GENTAMICIN <=1 SENSITIVE Sensitive     IMIPENEM <=0.25 SENSITIVE Sensitive     NITROFURANTOIN <=16 SENSITIVE Sensitive     TRIMETH/SULFA <=20 SENSITIVE Sensitive     AMPICILLIN/SULBACTAM <=2 SENSITIVE Sensitive     PIP/TAZO <=4 SENSITIVE Sensitive     * 30,000 COLONIES/mL ESCHERICHIA COLI  MRSA PCR Screening     Status: Abnormal   Collection Time: 07/24/15  2:58 AM  Result Value Ref Range Status   MRSA by PCR POSITIVE (A) NEGATIVE Final    Comment:        The GeneXpert MRSA Assay (FDA approved for NASAL specimens only), is one component of a comprehensive MRSA colonization surveillance program. It is not intended to diagnose MRSA infection nor to guide or monitor treatment for MRSA infections. RESULT CALLED TO, READ BACK BY AND VERIFIED WITH: M ENNIS,RN  07/24/15 MKELLY   Culture, respiratory (NON-Expectorated)     Status: None   Collection Time: 07/24/15  6:25 AM  Result Value Ref Range Status   Specimen Description SPUTUM  Final   Special Requests NTG  Final   Gram Stain   Final    MODERATE WBC PRESENT, PREDOMINANTLY PMN RARE SQUAMOUS EPITHELIAL CELLS PRESENT MODERATE YEAST FEW GRAM NEGATIVE RODS RARE GRAM POSITIVE RODS Performed at [facility]    Culture   Final    MODERATE YEAST CONSISTENT WITH CANDIDA SPECIES Performed at [facility]    Report Status 07/27/2015 FINAL  Final     Studies:  Recent x-ray studies have been reviewed in detail by the Attending Physician  Scheduled Meds:  Scheduled Meds: . cefTRIAXone (ROCEPHIN)  IV  1 g Intravenous Q24H  . Chlorhexidine Gluconate Cloth  6 each Topical Q0600  . fluconazole (DIFLUCAN) IV  200 mg Intravenous Q24H  . free water  200 mL Per Tube 6 X Daily  . heparin  5,000 Units Subcutaneous 3 times per day  . mupirocin ointment  1 application Nasal BID    Time spent  on care of this patient: 40 mins   WOODS, Roselind Messier , MD  [facility] Office  [phone] Pager [phone]  On-Call/Text Page:      [email or web address]      password [email or web address]  If 7PM-7AM, please contact night-coverage [email or web address] Password [email or web address] 07/27/2015, 9:22 PM   LOS: 3 days   Care during the described time interval was provided by me .  I have reviewed this patient's available data, including medical history, events of note, physical examination, and all test results as part of my evaluation. I have personally reviewed and interpreted all radiology studies.   Dia Crawford, MD [phone] Pager

## 2015-07-27 NOTE — Progress Notes (Signed)
CRITICAL VALUE ALERT  Critical value received : Na 168, Chloride >130  Date of notification:  07/27/15  Time of notification:  1405  Critical value read back:yes  Nurse who received alert:  Johnny Bridge A. Marina Goodell  MD notified (1st page):  [place]  Time of first page: 1418  MD notified (2nd page):  Time of second page:  Responding MD:    Time MD responded:

## 2015-07-28 DIAGNOSIS — R0600 Dyspnea, unspecified: Secondary | ICD-10-CM | POA: Insufficient documentation

## 2015-07-28 DIAGNOSIS — E43 Unspecified severe protein-calorie malnutrition: Secondary | ICD-10-CM | POA: Diagnosis present

## 2015-07-28 DIAGNOSIS — Z515 Encounter for palliative care: Secondary | ICD-10-CM

## 2015-07-28 DIAGNOSIS — Z66 Do not resuscitate: Secondary | ICD-10-CM | POA: Diagnosis not present

## 2015-07-28 LAB — GLUCOSE, CAPILLARY
GLUCOSE-CAPILLARY: 128 mg/dL — AB (ref 65–99)
GLUCOSE-CAPILLARY: 138 mg/dL — AB (ref 65–99)
GLUCOSE-CAPILLARY: 93 mg/dL (ref 65–99)
Glucose-Capillary: 151 mg/dL — ABNORMAL HIGH (ref 65–99)

## 2015-07-28 LAB — BASIC METABOLIC PANEL
BUN: 60 mg/dL — ABNORMAL HIGH (ref 6–20)
CO2: 17 mmol/L — AB (ref 22–32)
Calcium: 8.1 mg/dL — ABNORMAL LOW (ref 8.9–10.3)
Creatinine, Ser: 1.78 mg/dL — ABNORMAL HIGH (ref 0.61–1.24)
GFR calc non Af Amer: 32 mL/min — ABNORMAL LOW (ref >60)
GFR, EST AFRICAN AMERICAN: 37 mL/min — AB (ref >60)
GLUCOSE: 150 mg/dL — AB (ref 65–99)
POTASSIUM: 3.2 mmol/L — AB (ref 3.5–5.1)
Sodium: 163 mmol/L (ref 135–145)

## 2015-07-28 LAB — CULTURE, BLOOD (ROUTINE X 2)
CULTURE: NO GROWTH
CULTURE: NO GROWTH

## 2015-07-28 MED ORDER — MORPHINE SULFATE (CONCENTRATE) 10 MG/0.5ML PO SOLN: 5.0000 mg | ORAL | Status: DC | PRN

## 2015-07-28 MED ORDER — ACETAMINOPHEN 650 MG RE SUPP: 650.0000 mg | RECTAL | Status: AC | PRN

## 2015-07-28 MED ORDER — ACETAMINOPHEN 650 MG RE SUPP: 650.0000 mg | RECTAL | Status: DC | PRN

## 2015-07-28 MED ORDER — LORAZEPAM 1 MG PO TABS: 1.0000 mg | ORAL_TABLET | ORAL | Status: AC | PRN

## 2015-07-28 MED ORDER — MORPHINE SULFATE (CONCENTRATE) 10 MG/0.5ML PO SOLN: 5.0000 mg | ORAL | Status: AC | PRN

## 2015-07-28 MED ORDER — LORAZEPAM 1 MG PO TABS: 1.0000 mg | ORAL_TABLET | ORAL | Status: DC | PRN

## 2015-07-28 NOTE — Progress Notes (Signed)
CRITICAL VALUE ALERT  Critical value received: Sodium 163 and Chloride >130  Date of notification:  07/28/15  Time of notification:  0008  Critical value read back: YES  Nurse who received alert:  Janice Norrie, RN, BSN  MD notified (1st page):  Craige Cotta, NP  Time of first page:  0228  MD notified (2nd page):  Time of second page:  Responding MD:    Time MD responded:

## 2015-07-28 NOTE — Discharge Instructions (Signed)
Hospice ° °Hospice is a service that is designed to provide people who are terminally ill and their families with medical, spiritual, and psychological support. Its aim is to improve your quality of life by keeping you as alert and comfortable as possible. Hospice is performed by a team of health care professionals and volunteers who: °· Help keep you comfortable. Hospice can be provided in your home or in a homelike setting. The hospice staff works with your family and friends to help meet your needs. You will enjoy the support of loved ones by receiving much of your basic care from family and friends. °· Provide pain relief and manage your symptoms. The staff supply all necessary medicines and equipment. °· Provide companionship when you are alone. °· Allow you and your family to rest. They may do light housekeeping, prepare meals, and run errands. °· Provide counseling. They will make sure your emotional, spiritual, and social needs and those of your family are being met. °· Provide spiritual care. Spiritual care is individualized to meet your needs and your family's needs. It may involve helping you look at what death means to you, say goodbye, or perform a specific religious ceremony or ritual. °Hospice teams often include: °· A nurse. °· A doctor. °· Social workers. °· Religious leaders (such as a chaplain). °· Trained volunteers. °WHEN SHOULD HOSPICE CARE BEGIN? °Most people who use hospice are believed to have fewer than 6 months to live. Your family and health care providers can help you decide when hospice services should begin. If your condition improves, you may discontinue the program. °WHAT SHOULD I CONSIDER BEFORE SELECTING A PROGRAM? °Most hospice programs are run by nonprofit, independent organizations. Some are affiliated with hospitals, nursing homes, or home health care agencies. Hospice programs can take place in the home or at a hospice center, hospital, or skilled nursing facility. When  choosing a hospice program, ask the following questions: °· What services are available to me? °· What services are offered to my loved ones? °· How involved are my loved ones? °· How involved is my health care provider? °· Who makes up the hospice care team? How are they trained or screened? °· How will my pain and symptoms be managed? °· If my circumstances change, can the services be provided in a different setting, such as my home or in the hospital? °· Is the program reviewed and licensed by the state or certified in some other way? °WHERE CAN I LEARN MORE ABOUT HOSPICE? °You can learn about existing hospice programs in your area from your health care providers. You can also read more about hospice online. The websites of the following organizations contain helpful information: °· The National Hospice and Palliative Care Organization (NHPCO). °· The Hospice Association of America (HAA). °· The Hospice Education Institute. °· The American Cancer Society (ACS). °· Hospice Net. °  °This information is not intended to replace advice given to you by your health care provider. Make sure you discuss any questions you have with your health care provider. °  °Document Released: 09/22/2003 Document Revised: 06/10/2013 Document Reviewed: 04/15/2013 °Elsevier Interactive Patient Education ©2016 [employer]. ° °

## 2015-07-28 NOTE — Progress Notes (Signed)
NG tube removed at 13:12 on July 28, 2015. Pt tolerated well.

## 2015-07-28 NOTE — Progress Notes (Signed)
CSW consulted for transport home by [facility]- packet with hard script and DNR with RN  Patient will discharge to home Anticipated discharge date: 2/8 Family notified: son Transportation by [facility]- RN to call when pt is ready- after hours [facility] number is [phone] ext [phone] then ext [phone]  CSW signing off.  Merlyn Lot, LCSWA Clinical Social Worker [phone]

## 2015-07-28 NOTE — Progress Notes (Signed)
Spoke with Amy from [facility] who was trying to get in touch with pt other son.  RN was able to get in contact with Son named Selena Batten and informed him to give Amy a call to set up a time to sign consent when pt is discharged.

## 2015-07-28 NOTE — Discharge Summary (Addendum)
DISCHARGE SUMMARY  Greg Young  MR#: [id number]  DOB:24-Jun-1921  Date of Admission: 07/23/2015 Date of Discharge: 07/28/2015  Attending Physician:Paeton Latouche T  Patient's MWU:[id number],DGUYQ, MD  Consults:  Palliative Care  Disposition: D/C home for hospice/comfort focused care   Follow-up Appts:     Follow-up Information    Follow up with [facility], [place].   Specialty:  Internal Medicine   Why:  As needed   Contact information:   [address] [place] [address] [phone]      Discharge Diagnoses: Severe sepsis secondary to pan-sensitive Escherichia coli UTI  CAP v/s aspiration pneumonitis with acute hypoxic respiratory failure Acute encephalopathy in setting of dementia  AKI with hyperkalemia Hypernatremia  Hx of Hypertension  AAA Severe malnutrition in context of chronic illness, Underweight Deep Tissue Injury to sacrum, present on admission 3 cm x 4.2 cm Stage 1 pressure injury to right hip 2 cm x 1.5 cm  MRSA screen + DNR - NO CODE BLUE - DO NOT HOSPITALIZE  Initial presentation: 82 y.o. male with hx of moderate dementia (able to dress and feed himself at baseline) and hypertension who presented to the ED with his caregivers who reported decreased level of consciousness over 3 days. Patient was essentially bedbound for 3 days with no PO intake. EMS was activated and found him to be lethargic and responding to pain only. His blood pressure was 82/50 in the field and a 250 mL bolus was given. Patient was tachypneic on the scene and EMS was unable to obtain a pulse oximetry reading. He was transported with oxygen per nasal cannula and reportedly made some improvement en route.   In ED, patient was found to be afebrile, requiring 6 L/m supplemental oxygen to maintain saturations in the 90s, and with stable blood pressure. EKG demonstrated atrial fibrillation with a rate of 84. Chest x-ray revealed bibasilar opacities, reflecting  atelectasis or pneumonia. Foley catheter was placed and thick brown material returned. Blood work revealed BUN 163 and creatinine of 5.3 with sodium 152 potassium 5.9. There was a leukocytosis to 14,000. Initial lactate was 4.6. Patient was bolused with 2 L of normal saline in the emergency department, blood and urine cultures were obtained, and empiric Rocephin and azithromycin were administered intravenously.   Hospital Course:  The patient was admitted to the acute units and underwent aggressive volume resuscitation and broad-spectrum antibiotic administration.  Unfortunately, though he improved hemodynamically, his mental status did not improve throughout the course of his hospital stay.  With this delay in his recovery noted the family was contacted and made the decision to focus on his comfort with the desire to have him discharged home as soon as possible with hospice arrangements.  Palliative Care and case management were instrumental in assuring these were arrangements were complete and the patient was then discharged home.  Severe sepsis secondary to pan-sensitive Escherichia coli UTI  Blood pressure has normalized time of d/c - physiologically the patient appears to be slowly improving - mentally however he has not yet made significant improvement, and the POA has decided that focusing on his comfort in the home environment is the most appropriate plan of care - the medical team agrees  CAP v/s aspiration pneumonitis with acute hypoxic respiratory failure I suspect x-ray findings represented atelectasis as well as possible aspiration pneumonitis - no significant respiratory decline encountered   Acute encephalopathy in setting of dementia  Most consistent with toxic metabolic encephalopathy due to severe sepsis and uremia - no evidence  of significant improvement at time of d/c   AKI with hyperkalemia Cr 5.3 on admission - baseline 1.1 - creatinine continued to slowly improve with volume  resuscitation - elevated potassium resolved  Hypernatremia  Initially felt to be due to profound dehydration - worsened with isotonic saline - changed to hypotonic saline - consistent with significant free water deficit   Hx of Hypertension  Not an active issue at time of d/c   AAA 4.4cm at last measurement in Sept 2014 - presently 4.6cm  Protein-calorie malnutrition   MRSA screen +    Medication List    STOP taking these medications        azithromycin 500 MG tablet  Commonly known as:  ZITHROMAX     calcium carbonate 1500 (600 Ca) MG Tabs tablet  Commonly known as:  OSCAL     cefpodoxime 200 MG tablet  Commonly known as:  VANTIN     docusate sodium 100 MG capsule  Commonly known as:  COLACE     guaiFENesin 600 MG 12 hr tablet  Commonly known as:  MUCINEX     guaiFENesin-dextromethorphan 100-10 MG/5ML syrup  Commonly known as:  ROBITUSSIN DM     Melatonin 5 MG Tabs     PRESCRIPTION MEDICATION      TAKE these medications        acetaminophen 650 MG suppository  Commonly known as:  TYLENOL  Place 1 suppository (650 mg total) rectally every 4 (four) hours as needed for fever.     LORazepam 1 MG tablet  Commonly known as:  ATIVAN  Take 1 tablet (1 mg total) by mouth every 4 (four) hours as needed for anxiety.     morphine CONCENTRATE 10 MG/0.5ML Soln concentrated solution  Take 0.25 mLs (5 mg total) by mouth every hour as needed for moderate pain or shortness of breath.        Day of Discharge BP 129/92 mmHg  Pulse 66  Temp(Src) 99.7 F (37.6 C) (Core (Comment))  Resp 23  Ht 5\' 9"  (1.753 m)  Wt 56.1 kg (123 lb 10.9 oz)  BMI 18.26 kg/m2  SpO2 95%  Physical Exam: The pt does not appear to be in acute resp distress or pain, but he remains unresponsive.    Basic Metabolic Panel:  Recent Labs Lab 07/27/15 0440 07/27/15 1307 07/27/15 1710 07/27/15 2026 07/27/15 2322  NA 168* 168* 167* 164* 163*  K 3.0* 3.5 3.7 3.2* 3.2*  CL >130* >130*  >130* >130* >130*  CO2 16* 14* 13* 16* 17*  GLUCOSE 127* 125* 123* 157* 150*  BUN 72* 69* 68* 62* 60*  CREATININE 2.07* 1.96* 1.89* 1.86* 1.78*  CALCIUM 8.3* 8.3* 8.2* 8.1* 8.1*    Liver Function Tests:  Recent Labs Lab 07/23/15 2120 07/24/15 0358 07/25/15 0358 07/26/15 0538 07/27/15 0440  AST 45* 43* 49* 61* 63*  ALT 42 38 34 40 41  ALKPHOS 85 73 73 82 94  BILITOT 0.9 0.8 0.8 1.0 0.7  PROT 8.1 6.6 5.2* 5.4* 5.9*  ALBUMIN 2.4* 1.8* 1.6* 1.5* 1.6*   Coags:  Recent Labs Lab 07/24/15 0358  INR 1.40   CBC:  Recent Labs Lab 07/23/15 2120 07/24/15 0358 07/25/15 0358 07/26/15 0538 07/27/15 0440  WBC 14.0* 11.4* 10.0 15.9* 19.3*  NEUTROABS  --  10.3*  --   --   --   HGB 13.9 10.8* 9.8* 11.1* 12.0*  HCT 43.3 34.3* 30.9* 35.4* 39.8  MCV 88.9 88.2 88.0 89.2 88.6  PLT 379 299 275 323 385   CBG:  Recent Labs Lab 07/26/15 0405 07/27/15 2112 07/28/15 0047 07/28/15 0437 07/28/15 0808  GLUCAP 76 118* 128* 151* 138*    Recent Results (from the past 240 hour(s))  Blood Culture (routine x 2)     Status: None   Collection Time: 07/23/15  9:20 PM  Result Value Ref Range Status   Specimen Description BLOOD RIGHT ARM  Final   Special Requests BOTTLES DRAWN AEROBIC AND ANAEROBIC 5CC  Final   Culture NO GROWTH 5 DAYS  Final   Report Status 07/28/2015 FINAL  Final  Blood Culture (routine x 2)     Status: None   Collection Time: 07/23/15  9:20 PM  Result Value Ref Range Status   Specimen Description BLOOD LEFT ARM  Final   Special Requests BOTTLES DRAWN AEROBIC AND ANAEROBIC 5CC  Final   Culture NO GROWTH 5 DAYS  Final   Report Status 07/28/2015 FINAL  Final  Urine culture     Status: None   Collection Time: 07/23/15  9:20 PM  Result Value Ref Range Status   Specimen Description URINE, CATHETERIZED  Final   Special Requests NONE  Final   Culture NO GROWTH 2 DAYS  Final   Report Status 07/25/2015 FINAL  Final  Urine culture     Status: None   Collection Time:  07/23/15 10:10 PM  Result Value Ref Range Status   Specimen Description URINE, CATHETERIZED  Final   Special Requests NONE  Final   Culture 30,000 COLONIES/mL ESCHERICHIA COLI  Final   Report Status 07/26/2015 FINAL  Final   Organism ID, Bacteria ESCHERICHIA COLI  Final      Susceptibility   Escherichia coli - MIC*    AMPICILLIN <=2 SENSITIVE Sensitive     CEFAZOLIN <=4 SENSITIVE Sensitive     CEFTRIAXONE <=1 SENSITIVE Sensitive     CIPROFLOXACIN <=0.25 SENSITIVE Sensitive     GENTAMICIN <=1 SENSITIVE Sensitive     IMIPENEM <=0.25 SENSITIVE Sensitive     NITROFURANTOIN <=16 SENSITIVE Sensitive     TRIMETH/SULFA <=20 SENSITIVE Sensitive     AMPICILLIN/SULBACTAM <=2 SENSITIVE Sensitive     PIP/TAZO <=4 SENSITIVE Sensitive     * 30,000 COLONIES/mL ESCHERICHIA COLI  MRSA PCR Screening     Status: Abnormal   Collection Time: 07/24/15  2:58 AM  Result Value Ref Range Status   MRSA by PCR POSITIVE (A) NEGATIVE Final    Comment:        The GeneXpert MRSA Assay (FDA approved for NASAL specimens only), is one component of a comprehensive MRSA colonization surveillance program. It is not intended to diagnose MRSA infection nor to guide or monitor treatment for MRSA infections. RESULT CALLED TO, READ BACK BY AND VERIFIED WITH: M ENNIS,RN @0651  07/24/15 MKELLY   Culture, respiratory (NON-Expectorated)     Status: None   Collection Time: 07/24/15  6:25 AM  Result Value Ref Range Status   Specimen Description SPUTUM  Final   Special Requests NTG  Final   Gram Stain   Final    MODERATE WBC PRESENT, PREDOMINANTLY PMN RARE SQUAMOUS EPITHELIAL CELLS PRESENT MODERATE YEAST FEW GRAM NEGATIVE RODS RARE GRAM POSITIVE RODS Performed at [facility]    Culture   Final    MODERATE YEAST CONSISTENT WITH CANDIDA SPECIES Performed at [facility]    Report Status 07/27/2015 FINAL  Final      Time spent in discharge (includes decision making & examination  of pt): <30  minutes  07/28/2015, 2:28 PM   Lonia Blood, MD [facility] Office  [phone] Pager [phone]  On-Call/Text Page:      [email or web address]      password [facility]

## 2015-07-28 NOTE — Progress Notes (Signed)
Notified by Cammie Mcgee  [facility] of family request for [facility] services at home after discharge. Chart and patient information currently under review to confirm hospice eligibility.  Spoke with PCG Ivan Anchors by telephone  to initiate education related to hospice philosophy, services and team approach to care. PCG  verbalized understanding of the information provided.  I have been unable to reach his POA, Kathlene November  who lives in [place]. We did have some discussion regarding the TF and whether this would continue at home.  PCG  was not comfortable responding to this - I did also speak with the patients other son Selena Batten regarding this issue but he was unable to make a decision regarding the TF.   Discussed with Nurse practitioner Corrie Dandy who clarified that POA [facility] requested  full comfort only.   The   plan is for discharge to home today via PTAR.  Please send signed completed DNR form home with patient.  Patient will need prescriptions for discharge comfort medications.  DME needs discussed and PCG requested hospital bed. Overbed table, Oxygen concentrator with e-tanks, and suction machine.   [facility] equipment manager Jewel Kizzie Bane notified and will contact [facility] to arrange delivery to the home.  The home address has been verified and is correct in the chart;  Ivan Anchors is the PCG and should be contacted to arrange time of delivery.  [facility]erral [facility] aware of the above.  Completed discharge summary will need to be faxed to [facility] at [phone] when final.  Please notify [facility] when patient is ready to leave unit at discharge-call [phone].  [facility] information and contact numbers have been given to [facility]  during visit.  Above information shared with [facility].  Please call with any questions.  Thank You,  Lavone Neri, RN [facility] Liaison  [phone]

## 2015-07-28 NOTE — Consult Note (Signed)
Consultation Note Date: 07/28/2015   Patient Name: Greg Young  DOB: 07-04-1921  MRN: [id number]  Age / Sex: 82 y.o., male  PCP: Georgianne Fick, MD Referring Physician: Lonia Blood, MD  Reason for Consultation: Establishing goals of care, Hospice Evaluation, Non pain symptom management, Pain control and Psychosocial/spiritual support    Clinical Assessment/Narrative:   82 y.o. male with PMH of moderate dementia, able to dress and feed himself at baseline, and hypertension who presents to the ED with his caregivers who report decreased level of consciousness progressing over the past 3 days.    In ED, patient was found to be afebrile, requiring 6 L/m supplemental oxygen to maintain saturations in the 90s, and with stable blood pressure. EKG was obtained, demonstrating atrial fibrillation with a rate of 84. Chest x-ray reveals bibasilar opacities, possibly reflecting atelectasis or pneumonia.  Foley catheter was placed and thick brown material returned. Initial blood work reveals markedly deranged CMP with BUN 163 and serum creatinine of 5.3 with sodium 152 potassium 5.9. There is a leukocytosis to 14,000 on the CBC.   Patient admitted to the stepdown unit for ongoing evaluation and management of severe sepsis secondary to UTI; there is suspicion for CAP as well.  Family is faced with advanced directive decisions and anticipatory care needs.   This NP Lorinda Creed reviewed medical records, received report from team, assessed the patient and then at the patient's bedside,  spoke to his son Kathlene November [place]/ main decision maker by telephone Octaviano Mukai   # [phone]  to discuss diagnosis, prognosis, GOC, EOL wishes disposition and options. I also spoke with son Cordarro Spinnato and he is in agreement with plan of care.  Luian Schumpert plans to fly home, ETA is Friday.     A detailed discussion was had today regarding  advanced directives.  Concepts specific to code status, artifical feeding and hydration, continued IV antibiotics and rehospitalization was had.  The difference between a aggressive medical intervention path  and a palliative comfort care path for this patient at this time was had.  Values and goals of care important to patient and family were attempted to be elicited.  Concept of Hospice and Palliative Care were discussed  Natural trajectory and expectations at EOL were discussed.  Questions and concerns addressed.  Family encouraged to call with questions or concerns.  PMT will continue to support holistically.    SUMMARY OF RECOMMENDATIONS  -focus is full comfort, no life prolonging measures ---dc NG tube, no artifical feeding now or in the future -symptom management to enhance comfort, quality and dignity -home with hospice services, today if possible  (main caregiver in the home is [facility] # [phone])  Code Status/Advance Care Planning: DNR    Code Status Orders        Start     Ordered   07/24/15 0204  Do not attempt resuscitation (DNR)   Continuous    Question Answer Comment  In the event of cardiac or respiratory ARREST Do not call a "code blue"   In the event of cardiac or respiratory ARREST Do not perform Intubation, CPR, defibrillation or ACLS   In the event of cardiac or respiratory ARREST Use medication by any route, position, wound care, and other measures to relive pain and suffering. May use oxygen, suction and manual treatment of airway obstruction as needed for comfort.      07/24/15 0208    Code Status History    Date  Active Date Inactive Code Status Order ID Comments User Context   06/26/2013  4:58 PM 07/01/2013  6:54 PM DNR [id number]  Elease Etienne, MD Inpatient    Advance Directive Documentation        Most Recent Value   Type of Advance Directive  Healthcare Power of Attorney, Out of facility DNR (pink MOST or yellow form)   Pre-existing out of  facility DNR order (yellow form or pink MOST form)     "MOST" Form in Place?        Other Directives:None documented  Symptom Management:   Dyspnea/Pain: Roxanol 5 mg po/sl every 1 hr prn  Anxiety: Ativan 1 mg po/sl every 4 hrs prn  Palliative Prophylaxis:   Aspiration, Bowel Regimen, Delirium Protocol, Frequent Pain Assessment, Oral Care and Turn Reposition  Additional Recommendations (Limitations, Scope, Preferences):  Full Comfort Care   Psycho-social/Spiritual:  Support System: Strong Desire for further Chaplaincy support:no Additional Recommendations: Education on Hospice  Prognosis: < 2 weeks  Discharge Planning: Home with Hospice   Chief Complaint/ Primary Diagnoses: Present on Admission:  . Severe sepsis (HCC) . Hypertension . Dementia . Dehydration . CAP (community acquired pneumonia) . Acute renal failure (HCC) . Acute encephalopathy . Hyperkalemia . Hypernatremia . UTI (lower urinary tract infection)  I have reviewed the medical record, interviewed the patient and family, and examined the patient. The following aspects are pertinent.  Past Medical History  Diagnosis Date  . Hypertension   . Hard of hearing   . Dementia     "moderate dementia"   Social History   Social History  . Marital Status: Married    Spouse Name: N/A  . Number of Children: N/A  . Years of Education: N/A   Social History Main Topics  . Smoking status: Former Games developer  . Smokeless tobacco: Former Neurosurgeon    Types: Chew  . Alcohol Use: No  . Drug Use: No  . Sexual Activity: Not Asked   Other Topics Concern  . None   Social History Narrative   Family History  Problem Relation Age of Onset  . Family history unknown: Yes   Scheduled Meds: . cefTRIAXone (ROCEPHIN)  IV  1 g Intravenous Q24H  . Chlorhexidine Gluconate Cloth  6 each Topical Q0600  . fluconazole (DIFLUCAN) IV  200 mg Intravenous Q24H  . free water  200 mL Per Tube 6 X Daily  . heparin  5,000 Units  Subcutaneous 3 times per day  . mupirocin ointment  1 application Nasal BID   Continuous Infusions: . dextrose 75 mL/hr at 07/28/15 0255  . feeding supplement (JEVITY 1.2 CAL) 30 mL/hr (07/28/15 0102)   PRN Meds:. Medications Prior to Admission:  Prior to Admission medications   Medication Sig Start Date End Date Taking? Authorizing Provider  calcium carbonate (OSCAL) 1500 (600 Ca) MG TABS tablet Take 1,500 mg by mouth daily with breakfast.   Yes Historical Provider, MD  docusate sodium (COLACE) 100 MG capsule Take 100-200 mg by mouth 3 (three) times daily. Takes 2 caps in am, 1 cap at lunch, 2 caps in evening   Yes Historical Provider, MD  Melatonin 5 MG TABS Take 5 mg by mouth at bedtime.   Yes Historical Provider, MD  azithromycin (ZITHROMAX) 500 MG tablet Take 1 tablet (500 mg total) by mouth daily. 07/01/13   Elease Etienne, MD  cefpodoxime (VANTIN) 200 MG tablet Take 1 tablet (200 mg total) by mouth every 12 (twelve) hours. 07/01/13   Theadora Rama  D Hongalgi, MD  guaiFENesin (MUCINEX) 600 MG 12 hr tablet Take 1 tablet (600 mg total) by mouth 2 (two) times daily. 07/01/13   Elease Etienne, MD  guaiFENesin-dextromethorphan (ROBITUSSIN DM) 100-10 MG/5ML syrup Take 5 mLs by mouth every 4 (four) hours as needed for cough. 07/01/13   Elease Etienne, MD  PRESCRIPTION MEDICATION Take 1 capsule by mouth 3 (three) times daily. Antibiotic-unsure of kind 07/16/15   Historical Provider, MD   No Known Allergies  Review of Systems  Unable to perform ROS   Physical Exam  Constitutional: He appears lethargic. He appears cachectic. He appears ill.  Cardiovascular: Normal rate and regular rhythm.   Respiratory: He has decreased breath sounds in the right lower field and the left lower field.  Musculoskeletal:       Right shoulder: He exhibits decreased strength.  Neurological: He appears lethargic.  Skin: Skin is warm and dry.    Vital Signs: BP 109/63 mmHg  Pulse 91  Temp(Src) 100.6 F (38.1 C)  (Core (Comment))  Resp 25  Ht  (1.753 m)  Wt 56.1 kg (123 lb 10.9 oz)  BMI 18.26 kg/m2  SpO2 93%  SpO2: SpO2: 93 % O2 Device:SpO2: 93 % O2 Flow Rate: .O2 Flow Rate (L/min): 3 L/min  IO: Intake/output summary:  Intake/Output Summary (Last 24 hours) at 07/28/15 0913 Last data filed at 07/28/15 0600  Gross per 24 hour  Intake   1650 ml  Output   1050 ml  Net    600 ml    LBM: Last BM Date: 07/27/15 Baseline Weight: Weight: 55.7 kg (122 lb 12.7 oz) Most recent weight: Weight: 56.1 kg (123 lb 10.9 oz)      Palliative Assessment/Data:  Flowsheet Rows        Most Recent Value   Intake Tab    Referral Department  Hospitalist   Unit at Time of Referral  Intermediate Care Unit   Palliative Care Primary Diagnosis  Sepsis/Infectious Disease   Date Notified  07/26/15   Palliative Care Type  New Palliative care   Reason for referral  Clarify Goals of Care   Date of Admission  07/23/15   # of days IP prior to Palliative referral  3   Clinical Assessment    Psychosocial & Spiritual Assessment    Palliative Care Outcomes       Additional Data Reviewed:  CBC:    Component Value Date/Time   WBC 19.3* 07/27/2015 0440   HGB 12.0* 07/27/2015 0440   HCT 39.8 07/27/2015 0440   PLT 385 07/27/2015 0440   MCV 88.6 07/27/2015 0440   NEUTROABS 10.3* 07/24/2015 0358   LYMPHSABS 0.8 07/24/2015 0358   MONOABS 0.3 07/24/2015 0358   EOSABS 0.0 07/24/2015 0358   BASOSABS 0.0 07/24/2015 0358   Comprehensive Metabolic Panel:    Component Value Date/Time   NA 163* 07/27/2015 2322   K 3.2* 07/27/2015 2322   CL >130* 07/27/2015 2322   CO2 17* 07/27/2015 2322   BUN 60* 07/27/2015 2322   CREATININE 1.78* 07/27/2015 2322   GLUCOSE 150* 07/27/2015 2322   CALCIUM 8.1* 07/27/2015 2322   AST 63* 07/27/2015 0440   ALT 41 07/27/2015 0440   ALKPHOS 94 07/27/2015 0440   BILITOT 0.7 07/27/2015 0440   PROT 5.9* 07/27/2015 0440   ALBUMIN 1.6* 07/27/2015 0440   Discussed with Dr  Sharon Seller  Time In: 0845 Time Out: 1000 Time Total: 75 min Greater than 50%  of this time was  spent counseling and coordinating care related to the above assessment and plan.  Signed by: Lorinda Creed, NP  Canary Brim, NP  07/28/2015, 9:13 AM  Please contact Palliative Medicine Team phone at [phone] for questions and concerns.

## 2015-07-28 NOTE — Progress Notes (Signed)
Pt discharged.  Pt transported home via PTAR. IV's and telemetry removed.  Foley still in place per orders.  Caregiver [facility]) called and informed pt is on the way.

## 2015-07-28 NOTE — Care Management Note (Signed)
Case Management Note  Patient Details  Name: Burgess Sheriff MRN: [id number] Date of Birth: 02/09/1922  Subjective/Objective:   Palliative Care Team talked with son/POA who requested pt return home with 24-hour caregiver, Ivan Anchors [phone]), with [facility] [place].  Per [facility], referral was called in PTA by pt's PCP.  They will talk with caregiver to determine equipment needed and follow pt when discharged.                          Expected Discharge Plan:  Home w Hospice Care  Discharge planning Services  CM Consult  HH Arranged:  RN, Social Work [facility] Agency:  [facility] [place] [facility]  Status of Service:  Completed, signed off  [place], [place] 07/28/2015, 10:55 AM

## 2015-08-18 DEATH — deceased

## 2017-02-16 IMAGING — CR DG CHEST 1V PORT
1 series · 1 of 1 positions shown · non-contrast
Comparison: 06/27/2013

CLINICAL DATA: Sepsis.

EXAM:
PORTABLE CHEST 1 VIEW

[AP]
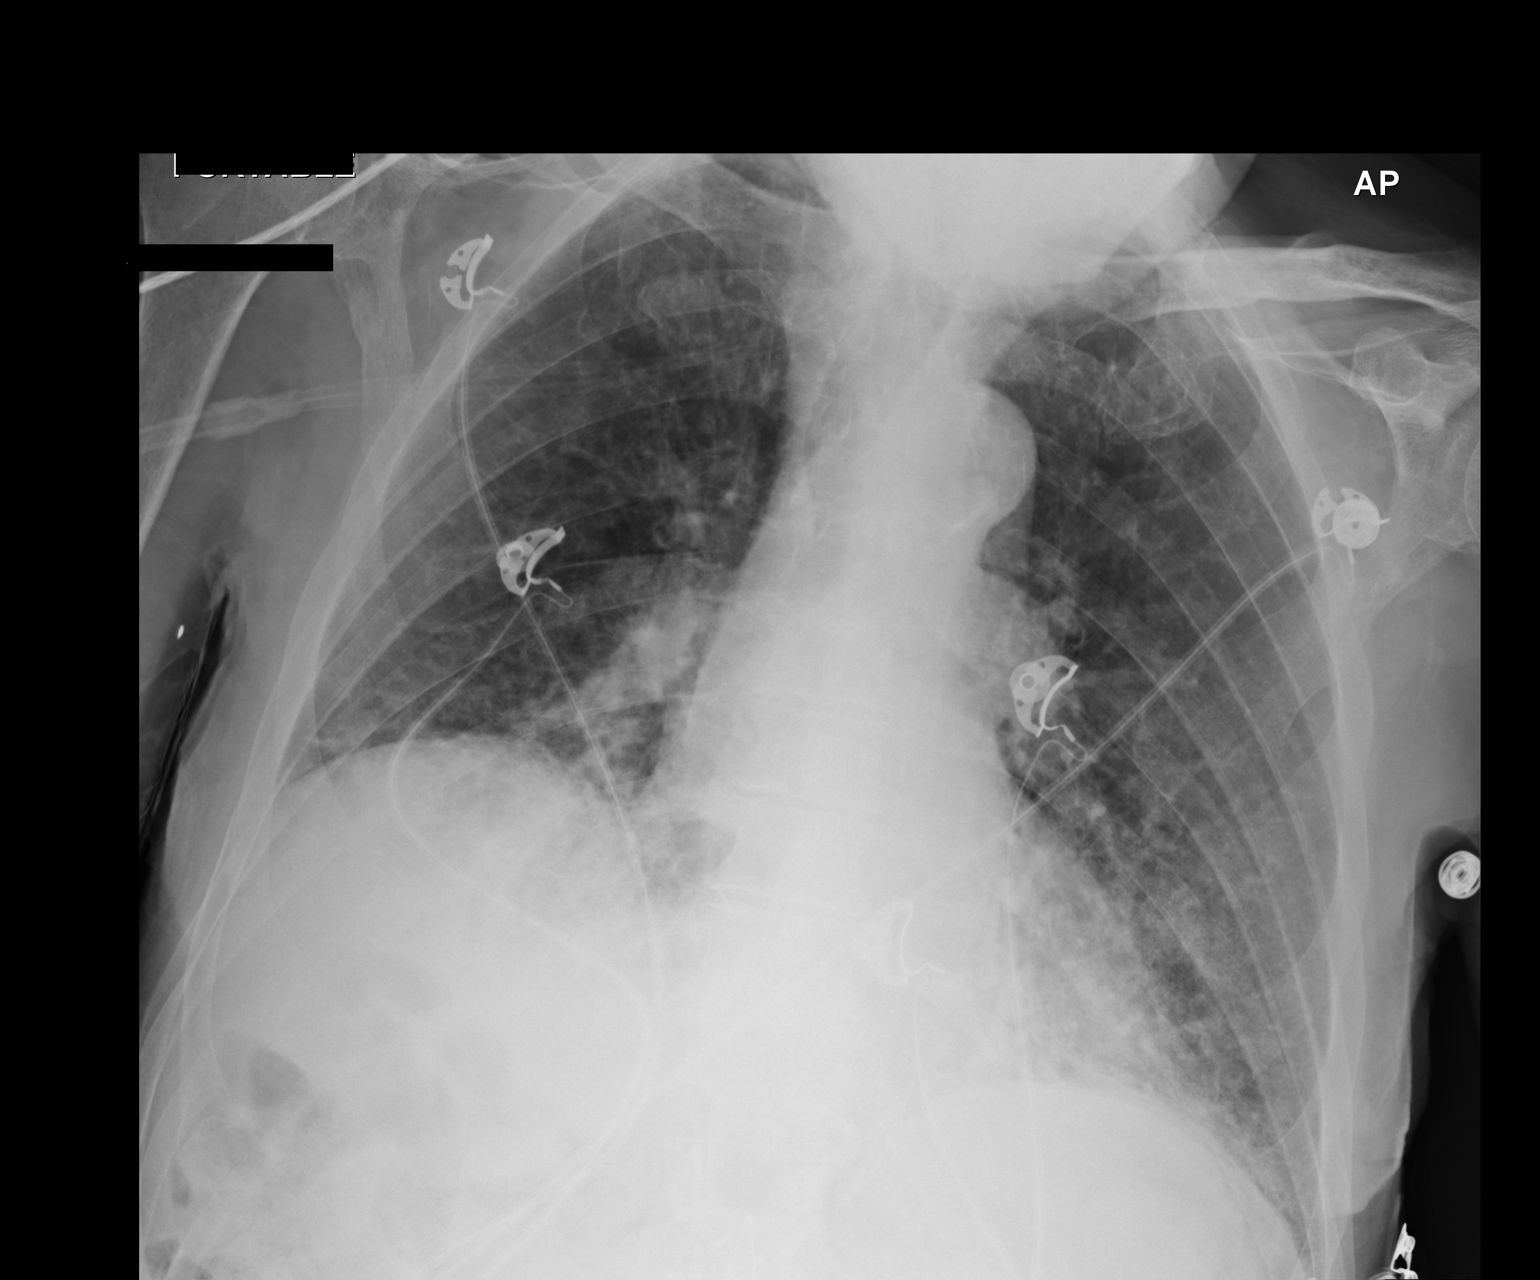

[1 of 1 positions shown; findings below may reference images not displayed]

FINDINGS: Chronic elevation of right hemidiaphragm. Cardiomediastinal contours
are unchanged. Bibasilar opacities are again seen, with interval
increase particularly on the left. No pulmonary edema. No large
pleural effusion or pneumothorax.
IMPRESSION: Bibasilar opacities, with interval increase from prior exam from two
years prior. This may reflect atelectasis or pneumonia. Given
distribution, aspiration is considered.

## 2017-02-20 IMAGING — CR DG ABDOMEN 1V
1 series · 1 of 1 positions shown · non-contrast
Comparison: CT abdomen and pelvis July 24, 2015

CLINICAL DATA: Feeding tube placement

EXAM:
ABDOMEN - 1 VIEW

[AP]
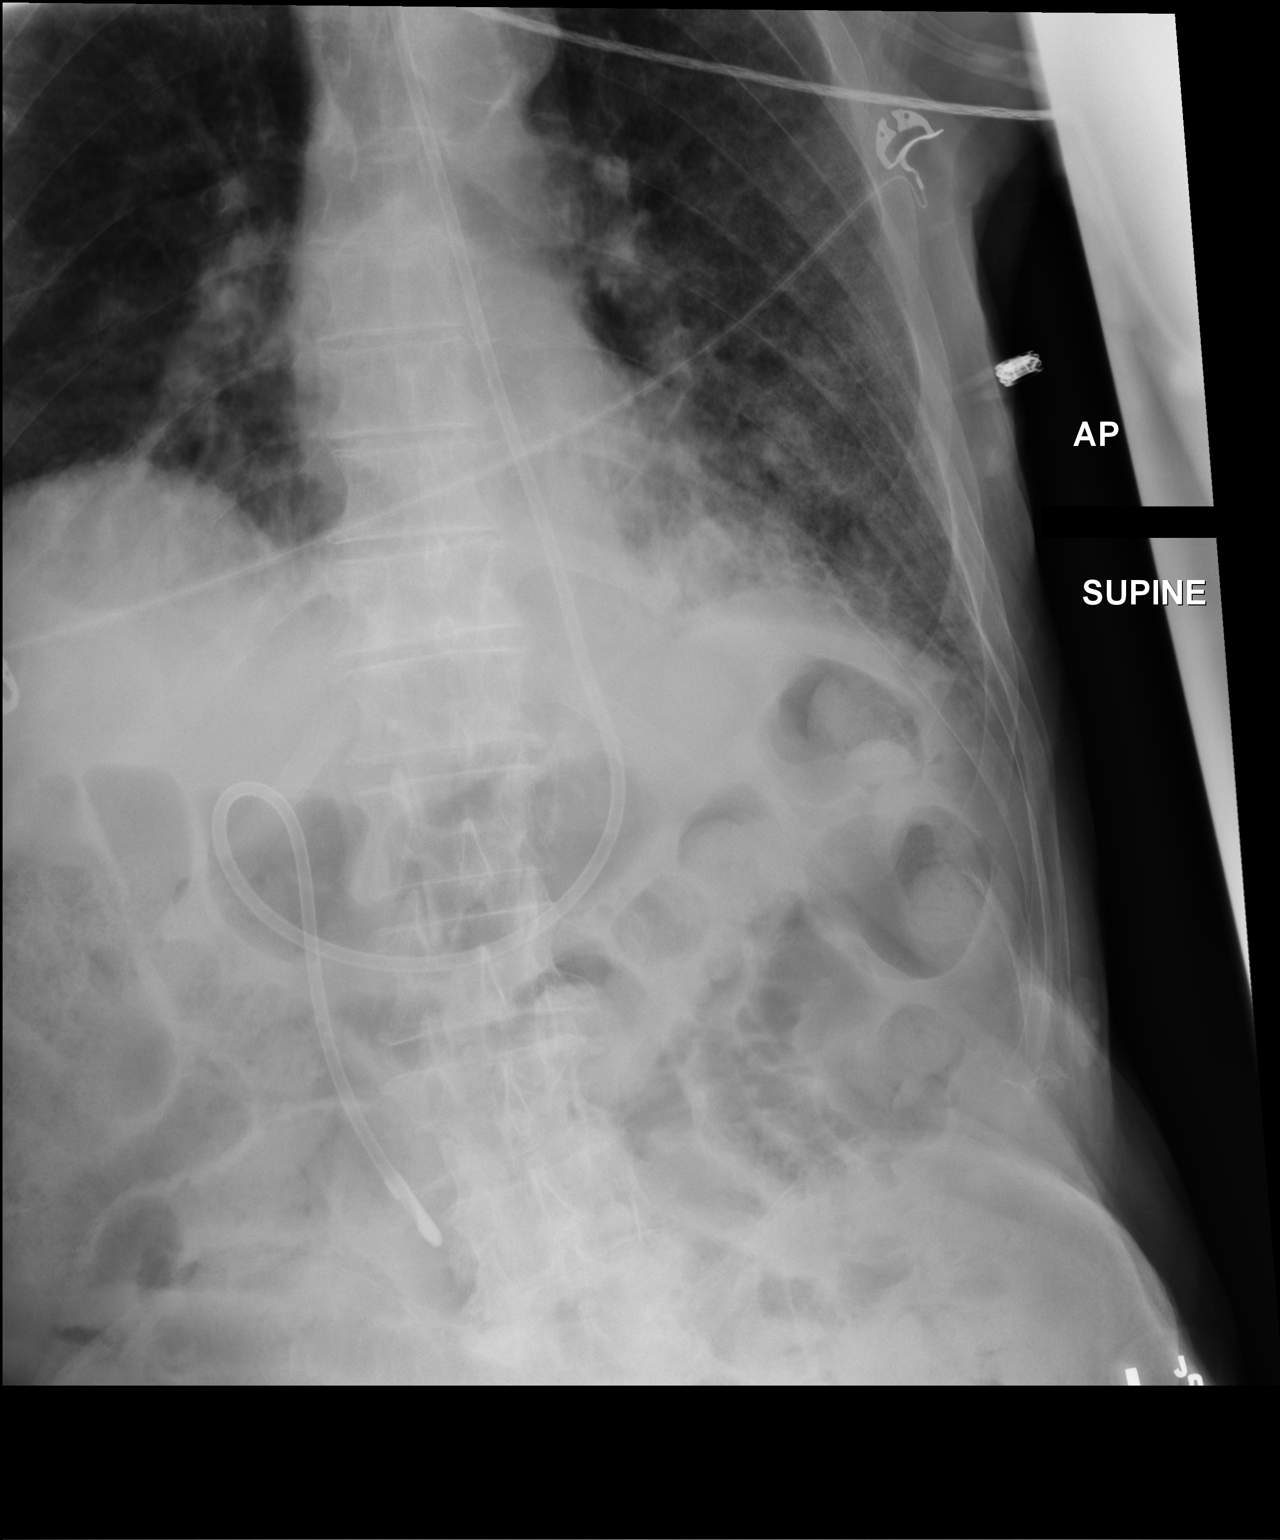

[1 of 1 positions shown; findings below may reference images not displayed]

FINDINGS: Feeding tube tip is in the region of the second portion of the
duodenum. The visualized bowel gas pattern is normal. No free air
evident on this supine examination. There is fibrotic change in the
left lung base region.
IMPRESSION: Feeding tube tip in second portion of the duodenum. Bowel gas
pattern unremarkable.
